# Patient Record
Sex: Male | Born: 1955 | Race: White | Hispanic: No | Marital: Married | Smoking: Former smoker
Health system: Southern US, Community
[De-identification: ages and names within clinical notes are randomized; demographics above are authoritative.]

## PROBLEM LIST (undated history)

## (undated) DIAGNOSIS — I2510 Atherosclerotic heart disease of native coronary artery without angina pectoris: Secondary | ICD-10-CM

## (undated) DIAGNOSIS — I10 Essential (primary) hypertension: Secondary | ICD-10-CM

## (undated) DIAGNOSIS — M1990 Unspecified osteoarthritis, unspecified site: Secondary | ICD-10-CM

## (undated) DIAGNOSIS — C801 Malignant (primary) neoplasm, unspecified: Secondary | ICD-10-CM

## (undated) DIAGNOSIS — G4730 Sleep apnea, unspecified: Secondary | ICD-10-CM

## (undated) DIAGNOSIS — G709 Myoneural disorder, unspecified: Secondary | ICD-10-CM

## (undated) DIAGNOSIS — F419 Anxiety disorder, unspecified: Secondary | ICD-10-CM

## (undated) DIAGNOSIS — E119 Type 2 diabetes mellitus without complications: Secondary | ICD-10-CM

## (undated) DIAGNOSIS — G459 Transient cerebral ischemic attack, unspecified: Secondary | ICD-10-CM

## (undated) HISTORY — PX: NO PAST SURGERIES: SHX2092

## (undated) HISTORY — PX: COLONOSCOPY: SHX174

---

## 2007-11-28 ENCOUNTER — Other Ambulatory Visit: Payer: Self-pay

## 2007-11-28 ENCOUNTER — Emergency Department: Payer: Self-pay | Admitting: Emergency Medicine

## 2013-04-24 ENCOUNTER — Inpatient Hospital Stay: Payer: Self-pay | Admitting: Internal Medicine

## 2013-04-24 LAB — URINALYSIS, COMPLETE
Bacteria: NONE SEEN
Bilirubin,UR: NEGATIVE
Blood: NEGATIVE
Glucose,UR: >=500 mg/dL (ref 0–75)
Nitrite: NEGATIVE
Ph: 5 (ref 4.5–8.0)
RBC,UR: 1 /HPF (ref 0–5)
Squamous Epithelial: 1
WBC UR: <1 /HPF (ref 0–5)

## 2013-04-24 LAB — COMPREHENSIVE METABOLIC PANEL
Anion Gap: 27 — ABNORMAL HIGH (ref 7–16)
Calcium, Total: 9.3 mg/dL (ref 8.5–10.1)
EGFR (Non-African Amer.): 49 — ABNORMAL LOW
Glucose: 496 mg/dL — ABNORMAL HIGH (ref 65–99)
Osmolality: 292 (ref 275–301)
SGOT(AST): 45 U/L — ABNORMAL HIGH (ref 15–37)
SGPT (ALT): 32 U/L (ref 12–78)
Sodium: 130 mmol/L — ABNORMAL LOW (ref 136–145)

## 2013-04-24 LAB — RAPID INFLUENZA A&B ANTIGENS

## 2013-04-24 LAB — CBC
HCT: 53.6 % — ABNORMAL HIGH (ref 40.0–52.0)
HGB: 17.2 g/dL (ref 13.0–18.0)
MCHC: 32 g/dL (ref 32.0–36.0)
MCV: 92 fL (ref 80–100)
Platelet: 365 10*3/uL (ref 150–440)
RDW: 14.6 % — ABNORMAL HIGH (ref 11.5–14.5)

## 2013-04-25 LAB — CBC WITH DIFFERENTIAL/PLATELET
Basophil %: 0.6 %
Eosinophil #: 0 10*3/uL (ref 0.0–0.7)
Eosinophil %: 0 %
Lymphocyte %: 8.7 %
MCH: 29.4 pg (ref 26.0–34.0)
Monocyte #: 1.7 x10 3/mm — ABNORMAL HIGH (ref 0.2–1.0)
Monocyte %: 16.6 %
Neutrophil #: 7.5 10*3/uL — ABNORMAL HIGH (ref 1.4–6.5)
Neutrophil %: 74.1 %
Platelet: 190 10*3/uL (ref 150–440)
RBC: 4.64 10*6/uL (ref 4.40–5.90)
RDW: 13.4 % (ref 11.5–14.5)
WBC: 10.1 10*3/uL (ref 3.8–10.6)

## 2013-04-25 LAB — HEMOGLOBIN A1C: Hemoglobin A1C: 12.1 % — ABNORMAL HIGH (ref 4.2–6.3)

## 2013-04-25 LAB — BASIC METABOLIC PANEL
Anion Gap: 10 (ref 7–16)
BUN: 29 mg/dL — ABNORMAL HIGH (ref 7–18)
Calcium, Total: 7.9 mg/dL — ABNORMAL LOW (ref 8.5–10.1)
Chloride: 106 mmol/L (ref 98–107)
Osmolality: 277 (ref 275–301)
Potassium: 2.8 mmol/L — ABNORMAL LOW (ref 3.5–5.1)
Sodium: 133 mmol/L — ABNORMAL LOW (ref 136–145)

## 2013-04-26 LAB — BASIC METABOLIC PANEL
Anion Gap: 7 (ref 7–16)
BUN: 12 mg/dL (ref 7–18)
Calcium, Total: 7.8 mg/dL — ABNORMAL LOW (ref 8.5–10.1)
Co2: 19 mmol/L — ABNORMAL LOW (ref 21–32)
Potassium: 4.1 mmol/L (ref 3.5–5.1)
Sodium: 138 mmol/L (ref 136–145)

## 2013-04-26 LAB — CBC WITH DIFFERENTIAL/PLATELET
Basophil %: 0.6 %
Eosinophil #: 0 10*3/uL (ref 0.0–0.7)
Eosinophil %: 0 %
HCT: 34.7 % — ABNORMAL LOW (ref 40.0–52.0)
HGB: 12.1 g/dL — ABNORMAL LOW (ref 13.0–18.0)
MCH: 29.4 pg (ref 26.0–34.0)
MCV: 85 fL (ref 80–100)
Monocyte #: 1.6 x10 3/mm — ABNORMAL HIGH (ref 0.2–1.0)
Monocyte %: 10.4 %
Neutrophil %: 77.8 %
Platelet: 184 10*3/uL (ref 150–440)
RBC: 4.1 10*6/uL — ABNORMAL LOW (ref 4.40–5.90)
RDW: 14 % (ref 11.5–14.5)

## 2013-04-27 LAB — BASIC METABOLIC PANEL
Glucose: 166 mg/dL — ABNORMAL HIGH (ref 65–99)
Potassium: 3.5 mmol/L (ref 3.5–5.1)
Sodium: 142 mmol/L (ref 136–145)

## 2013-04-27 LAB — CBC WITH DIFFERENTIAL/PLATELET
Basophil #: 0.1 10*3/uL (ref 0.0–0.1)
Basophil %: 0.3 %
Eosinophil #: 0 10*3/uL (ref 0.0–0.7)
Eosinophil %: 0 %
HCT: 39 % — ABNORMAL LOW (ref 40.0–52.0)
Lymphocyte #: 2.5 10*3/uL (ref 1.0–3.6)
MCHC: 34.6 g/dL (ref 32.0–36.0)
MCV: 85 fL (ref 80–100)
Monocyte #: 0.8 x10 3/mm (ref 0.2–1.0)
Monocyte %: 5.2 %
Neutrophil #: 12.2 10*3/uL — ABNORMAL HIGH (ref 1.4–6.5)
Neutrophil %: 78.6 %
Platelet: 206 10*3/uL (ref 150–440)
RDW: 14.1 % (ref 11.5–14.5)

## 2014-05-28 ENCOUNTER — Ambulatory Visit: Payer: Self-pay

## 2014-05-28 LAB — COMPREHENSIVE METABOLIC PANEL
Albumin: 4 g/dL (ref 3.4–5.0)
Alkaline Phosphatase: 176 U/L — ABNORMAL HIGH (ref 46–116)
Anion Gap: 10 (ref 7–16)
BILIRUBIN TOTAL: 0.5 mg/dL (ref 0.2–1.0)
BUN: 14 mg/dL (ref 7–18)
CO2: 29 mmol/L (ref 21–32)
CREATININE: 0.96 mg/dL (ref 0.60–1.30)
Calcium, Total: 9.6 mg/dL (ref 8.5–10.1)
Chloride: 95 mmol/L — ABNORMAL LOW (ref 98–107)
Glucose: 303 mg/dL — ABNORMAL HIGH (ref 65–99)
OSMOLALITY: 280 (ref 275–301)
Potassium: 4.3 mmol/L (ref 3.5–5.1)
SGOT(AST): 16 U/L (ref 15–37)
SGPT (ALT): 27 U/L (ref 14–63)
SODIUM: 134 mmol/L — AB (ref 136–145)
Total Protein: 8.2 g/dL (ref 6.4–8.2)

## 2014-05-28 LAB — CBC WITH DIFFERENTIAL/PLATELET
BASOS PCT: 1.1 %
Basophil #: 0.2 10*3/uL — ABNORMAL HIGH (ref 0.0–0.1)
EOS ABS: 0 10*3/uL (ref 0.0–0.7)
Eosinophil %: 0.1 %
HCT: 45.1 % (ref 40.0–52.0)
HGB: 15.1 g/dL (ref 13.0–18.0)
Lymphocyte #: 2.3 10*3/uL (ref 1.0–3.6)
Lymphocyte %: 13.2 %
MCH: 28.3 pg (ref 26.0–34.0)
MCHC: 33.4 g/dL (ref 32.0–36.0)
MCV: 85 fL (ref 80–100)
Monocyte #: 1.3 x10 3/mm — ABNORMAL HIGH (ref 0.2–1.0)
Monocyte %: 7.1 %
NEUTROS PCT: 78.5 %
Neutrophil #: 13.9 10*3/uL — ABNORMAL HIGH (ref 1.4–6.5)
Platelet: 319 10*3/uL (ref 150–440)
RBC: 5.32 10*6/uL (ref 4.40–5.90)
RDW: 12.8 % (ref 11.5–14.5)
WBC: 17.7 10*3/uL — AB (ref 3.8–10.6)

## 2014-05-28 LAB — SEDIMENTATION RATE: ERYTHROCYTE SED RATE: 48 mm/h — AB (ref 0–20)

## 2014-07-29 DIAGNOSIS — N529 Male erectile dysfunction, unspecified: Secondary | ICD-10-CM | POA: Insufficient documentation

## 2014-08-22 NOTE — H&P (Signed)
PATIENT NAME:  ILIJA, Troy [name] MR#:  [id number] DATE OF BIRTH:  May 15, 1955  DATE OF ADMISSION:  04/24/2013  PRIMARY CARE PHYSICIAN: At [name].   CHIEF COMPLAINT: Nausea, vomiting, abdominal pain, along with cough.   HISTORY OF PRESENT ILLNESS: [name] 59 year old male patient who has not been feeling well for 5 days now including cough and weakness, presents to the hospital after he had acute onset of nausea, vomiting, abdominal pain, started yesterday. The patient has not taken his Levemir 10 units b.i.d. dose for 2 days, as he has not had an appetite and did not eat much. Today, he has been found to be in DKA with bicarb of 6, blood sugars are greater than 500.   He mentioned that normally his blood sugars run around 150s. He is on Invokana and Levemir 10 units twice [name] day, and also uses NovoLog 3 to 4 units prior to meals. He has never had DKA in the past.   Presently, his nausea, vomiting and abdominal pain are [name] little better in the Emergency Room. The patient is being admitted for DKA.   PAST MEDICAL HISTORY:  Insulin-dependent diabetes mellitus.   PAST SURGICAL HISTORY: None.   SOCIAL HISTORY: The patient does not smoke, rare alcohol use. No illicit drugs. Works as [name] Pharmacist, hospital.   ALLERGIES: No known drug allergies.   CODE STATUS: Full code.   FAMILY HISTORY: Heart attack in his mother, which killed her.   REVIEW OF SYSTEMS:  CONSTITUTIONAL:  Complains of fatigue, weakness.  EYES: No blurred vision, pain or redness.  EARS, NOSE, THROAT: No tinnitus, ear pain, hearing loss.  RESPIRATORY:  Has cough.  CARDIOVASCULAR: No chest pain, orthopnea or edema.  GASTROINTESTINAL: No nausea, vomiting, diarrhea or abdominal pain.  GENITOURINARY: No dysuria, hematuria or frequency.  ENDOCRINE: No polyuria, nocturia or thyroid problems.  HEMATOLOGIC AND LYMPHATIC: No anemia, easy bruising or bleeding.  INTEGUMENTARY: No acne, rash or lesions.  MUSCULOSKELETAL: No back pain or arthritis.   NEUROLOGIC: No focal numbness, weakness or seizures. Has generalized weakness.  PSYCHIATRIC: No anxiety or depression.   HOME MEDICATIONS: Include:  1.  Levemir 10 units subQ b.i.d.  2.  Lovenox 3 to 4 units prior to meals.  3.  Invokana 1 tablet oral twice [name] day.  4.  Aspirin 325 mg daily.   PHYSICAL EXAMINATION: VITAL SIGNS: Shows temperature of 97.1 pulse of 111, respirations 20, blood pressure 163/70, saturating 96% on room air.  GENERAL: Moderately-built Caucasian male patient lying in bed in mild distress secondary to his abdominal pain.  PSYCHIATRIC:  He is alert and oriented x 3, restless.  HEENT: Atraumatic, normocephalic. Oral mucosa dry and pink. Pallor positive. No icterus. Pupils bilaterally equal and reactive to light.    NECK: Supple. No thyromegaly or palpable lymph nodes. Trachea midline. No carotid bruit or JVD.  CARDIOVASCULAR: S1, S2, tachycardic without any murmurs. Peripheral pulses 2+. No edema.  RESPIRATORY: Normal work of breathing with decreased air entry in the right base. No crackles or wheezing or rhonchi.  GASTROINTESTINAL: Soft abdomen. Tenderness diffusely. No rigidity or guarding. Bowel sounds present. No hepatosplenomegaly palpable.  GENITOURINARY:  No CVA tenderness or bladder distention.  SKIN: Warm and dry. No petechiae, rash or ulcers.  MUSCULOSKELETAL: No joint swelling, redness, effusion of the large joints. Normal muscle tone.  NEUROLOGICAL: Motor strength 5/5 in upper and lower extremities. Sensation is intact all over.  LYMPHATICS:  No cervical lymphadenopathy.   LABORATORY STUDIES: Show glucose of 495, 457  and 496. BUN 37, creatinine 1.55, sodium 130, potassium 4.8, chloride 97, bicarb of 6, GFR 49. AST, ALT, alk phos normal.  WBC 29.7 with hemoglobin 17.2, platelets of 365. Influenza [name] and B negative. Urinalysis shows no bacteria.   Lactic acid of 3.3.   ABG is pending.   Chest x-ray shows right lower lobe pneumonia.   ASSESSMENT AND  PLAN: 1.  Diabetic ketoacidosis with severely low bicarb. I expect the patient's pH to be extremely low.  This is secondary to the patient missing his Levemir at home and also having concurrent infection with pneumonia. The  patient has received 2 liters in the Emergency Room. We will bolus NS 2 liters normal saline stat and start him on aggressive IV fluid resuscitation. The patient will be admitted to CCU on an insulin drip with q.1 hour Accu-Cheks. BMP will be checked every 4 hours. We will start him on 20 units of Levemir once his bicarb and anion gap are better and can start diet.  2.  Right lower lobe pneumonia. The patient's symptoms started prior to his nausea and vomiting, so I do not suspect aspiration pneumonia in this patient.  We will treat him with Levaquin. The patient does have sepsis secondary to the pneumonia.  3.  Acute renal failure secondary to severe dehydration. We will start him on IV fluids and repeat creatinine in the morning.  4.  Pseudohyponatremia from the hyperglycemia.  5.  Deep vein thrombosis prophylaxis with Lovenox.   Time Spent today on this case of critically ill patient was 60 minutes.     ____________________________ Troy Alf Kelcey Wickstrom, MD srs:dmm D: 04/24/2013 19:36:16 ET T: 04/24/2013 19:56:06 ET JOB#: [id number]  cc: Alveta Heimlich R. Kerin Kren, MD, <Dictator> [facility] MD ELECTRONICALLY SIGNED 04/25/2013 15:42

## 2014-08-22 NOTE — Discharge Summary (Signed)
PATIENT NAME:  [name], [name] MR#:  [id number] DATE OF BIRTH:  October 06, 1955  DATE OF ADMISSION:  04/24/2013 DATE OF DISCHARGE:  04/27/2013  ADMISSION DIAGNOSES:   1.  Sepsis.   2.  Community-acquired pneumonia.   DISCHARGE DIAGNOSES: 1.  Sepsis, severe upon arrival secondary to community-acquired pneumonia.  2.  Community-acquired pneumonia. 3.  Diabetic ketoacidosis.  4.  Hypotension.  5.  Acute renal failure.   CONSULTATIONS:  None.    LABORATORY DATA AT DISCHARGE:  White blood cells 15, hemoglobin 13.5, hematocrit 39, platelets are 206.  Sodium 142, potassium 3.5, chloride 108, bicarb 29, BUN 11, creatinine 0.6, his glucose is 166.  Blood cultures negative to date.   HOSPITAL COURSE:  This is a very pleasant 59 year old male who was brought in with severe sepsis secondary to pneumonia and also found to have DKA.  For further details, please refer to the H and P. 1.  Severe sepsis.  The patient initially was admitted with tachycardia, leukocytosis and pneumonia on chest x-ray.  We treated his pneumonia.  His sepsis has resolved.  His blood cultures are negative to date.   2.  Community-acquired pneumonia.  The patient is on Levaquin.  The patient will continue Levaquin for two more days.  Blood cultures negative to date.  3.  DKA triggered by sepsis/pneumonia.  The patient was off of his insulin also which likely contributed to the DKA.  His hemoglobin A1c was 12, which shows significantly poor control as an outpatient.  The patient is very reluctant for overaggressive treatment due to a history of hypoglycemia in the past.  4.  Hypotension secondary to sepsis, improved with IV fluids.  5.  Acute renal failure secondary to poor perfusion from sepsis, which improved with IV fluids and treatment for sepsis.   DISCHARGE MEDICATIONS:  1.  Aspirin 325 mg daily.  2.  NovoLog sliding scale.  3.  Multivitamin 1 tablet daily.  4.  Levaquin 750 mg q. 24 hours x 2 days.  5.  Detemir 10 units  twice daily.   DISCHARGE DIET:  ADA diet.   DISCHARGE ACTIVIT:  As tolerated.   DISCHARGE FOLLOW-UP:  The patient will need to follow up with his primary care physician in 1 to 2 weeks.    TIME SPENT:  35 minutes.  The patient is medically stable for discharge.    ____________________________ Edna Rede P. Benjie Karvonen, MD spm:ea D: 04/27/2013 13:52:47 ET T: 04/27/2013 23:29:09 ET JOB#: [id number]  cc: Carvell Hoeffner P. Benjie Karvonen, MD, <Dictator> [facility] Donell Beers Ronika Kelson MD ELECTRONICALLY SIGNED 04/28/2013 14:46

## 2015-01-24 ENCOUNTER — Emergency Department: Payer: BC Managed Care – PPO

## 2015-01-24 ENCOUNTER — Encounter: Payer: Self-pay | Admitting: *Deleted

## 2015-01-24 DIAGNOSIS — I10 Essential (primary) hypertension: Secondary | ICD-10-CM | POA: Insufficient documentation

## 2015-01-24 DIAGNOSIS — I739 Peripheral vascular disease, unspecified: Secondary | ICD-10-CM | POA: Insufficient documentation

## 2015-01-24 DIAGNOSIS — Z794 Long term (current) use of insulin: Secondary | ICD-10-CM | POA: Insufficient documentation

## 2015-01-24 DIAGNOSIS — R531 Weakness: Secondary | ICD-10-CM | POA: Insufficient documentation

## 2015-01-24 DIAGNOSIS — Z87891 Personal history of nicotine dependence: Secondary | ICD-10-CM | POA: Insufficient documentation

## 2015-01-24 DIAGNOSIS — Z79899 Other long term (current) drug therapy: Secondary | ICD-10-CM | POA: Diagnosis not present

## 2015-01-24 DIAGNOSIS — Z7982 Long term (current) use of aspirin: Secondary | ICD-10-CM | POA: Diagnosis not present

## 2015-01-24 DIAGNOSIS — Z8673 Personal history of transient ischemic attack (TIA), and cerebral infarction without residual deficits: Secondary | ICD-10-CM | POA: Insufficient documentation

## 2015-01-24 DIAGNOSIS — E1165 Type 2 diabetes mellitus with hyperglycemia: Secondary | ICD-10-CM | POA: Insufficient documentation

## 2015-01-24 DIAGNOSIS — R51 Headache: Secondary | ICD-10-CM | POA: Diagnosis not present

## 2015-01-24 DIAGNOSIS — R202 Paresthesia of skin: Secondary | ICD-10-CM | POA: Diagnosis not present

## 2015-01-24 DIAGNOSIS — M79661 Pain in right lower leg: Secondary | ICD-10-CM | POA: Diagnosis present

## 2015-01-24 DIAGNOSIS — R739 Hyperglycemia, unspecified: Secondary | ICD-10-CM | POA: Diagnosis present

## 2015-01-24 DIAGNOSIS — R200 Anesthesia of skin: Secondary | ICD-10-CM | POA: Diagnosis not present

## 2015-01-24 LAB — COMPREHENSIVE METABOLIC PANEL
ALT: 30 U/L (ref 17–63)
AST: 37 U/L (ref 15–41)
Albumin: 4.3 g/dL (ref 3.5–5.0)
Alkaline Phosphatase: 124 U/L (ref 38–126)
Anion gap: 10 (ref 5–15)
BILIRUBIN TOTAL: 0.5 mg/dL (ref 0.3–1.2)
BUN: 17 mg/dL (ref 6–20)
CO2: 27 mmol/L (ref 22–32)
Calcium: 9.8 mg/dL (ref 8.9–10.3)
Chloride: 99 mmol/L — ABNORMAL LOW (ref 101–111)
Creatinine, Ser: 0.85 mg/dL (ref 0.61–1.24)
Glucose, Bld: 393 mg/dL — ABNORMAL HIGH (ref 65–99)
POTASSIUM: 3.3 mmol/L — AB (ref 3.5–5.1)
Sodium: 136 mmol/L (ref 135–145)
TOTAL PROTEIN: 7.2 g/dL (ref 6.5–8.1)

## 2015-01-24 LAB — DIFFERENTIAL
BASOS ABS: 0 10*3/uL (ref 0–0.1)
Basophils Relative: 0 %
EOS ABS: 0.1 10*3/uL (ref 0–0.7)
EOS PCT: 1 %
LYMPHS ABS: 3.2 10*3/uL (ref 1.0–3.6)
Lymphocytes Relative: 33 %
MONO ABS: 0.8 10*3/uL (ref 0.2–1.0)
MONOS PCT: 9 %
Neutro Abs: 5.6 10*3/uL (ref 1.4–6.5)
Neutrophils Relative %: 57 %

## 2015-01-24 LAB — CBC
HEMATOCRIT: 42.7 % (ref 40.0–52.0)
HEMOGLOBIN: 14.5 g/dL (ref 13.0–18.0)
MCH: 28.9 pg (ref 26.0–34.0)
MCHC: 33.9 g/dL (ref 32.0–36.0)
MCV: 85.2 fL (ref 80.0–100.0)
Platelets: 254 10*3/uL (ref 150–440)
RBC: 5.02 MIL/uL (ref 4.40–5.90)
RDW: 13.2 % (ref 11.5–14.5)
WBC: 9.7 10*3/uL (ref 3.8–10.6)

## 2015-01-24 LAB — TROPONIN I

## 2015-01-24 LAB — GLUCOSE, CAPILLARY: Glucose-Capillary: 364 mg/dL — ABNORMAL HIGH (ref 65–99)

## 2015-01-24 LAB — PROTIME-INR
INR: 0.96
Prothrombin Time: 13 seconds (ref 11.4–15.0)

## 2015-01-24 LAB — APTT: aPTT: 24 seconds (ref 24–36)

## 2015-01-24 NOTE — ED Notes (Signed)
Pt c/o R facial and R arm numbness and tingling accompanied w/ headache, pt states it was not a bad headache, but he never gets headaches per his report. Pt states the tingling persisted in R upper lip. Pt c/o tightness in R calf starting today @ 1800. Pt c/o weakness and fatigue yesterday afternoon after the incident occurred at 1400 yesterday. Grips and push pulls in upper and lower extremities are equal and strong. No facial droop or aphasia noted. No tongue deviation noted. Pt is ambulatory and A&O x 4.

## 2015-01-25 ENCOUNTER — Observation Stay: Payer: BC Managed Care – PPO

## 2015-01-25 ENCOUNTER — Observation Stay
Admission: EM | Admit: 2015-01-25 | Discharge: 2015-01-25 | Disposition: A | Discharge status: Home / Self Care | Payer: BC Managed Care – PPO | Attending: Internal Medicine | Admitting: Internal Medicine

## 2015-01-25 ENCOUNTER — Observation Stay: Admit: 2015-01-25 | Payer: BC Managed Care – PPO

## 2015-01-25 ENCOUNTER — Emergency Department: Payer: BC Managed Care – PPO

## 2015-01-25 DIAGNOSIS — R739 Hyperglycemia, unspecified: Secondary | ICD-10-CM

## 2015-01-25 DIAGNOSIS — G459 Transient cerebral ischemic attack, unspecified: Secondary | ICD-10-CM | POA: Diagnosis present

## 2015-01-25 DIAGNOSIS — T148XXA Other injury of unspecified body region, initial encounter: Secondary | ICD-10-CM

## 2015-01-25 DIAGNOSIS — G451 Carotid artery syndrome (hemispheric): Secondary | ICD-10-CM | POA: Diagnosis not present

## 2015-01-25 DIAGNOSIS — M79661 Pain in right lower leg: Secondary | ICD-10-CM

## 2015-01-25 DIAGNOSIS — I639 Cerebral infarction, unspecified: Secondary | ICD-10-CM

## 2015-01-25 HISTORY — DX: Type 2 diabetes mellitus without complications: E11.9

## 2015-01-25 LAB — GLUCOSE, CAPILLARY
Glucose-Capillary: 67 mg/dL (ref 65–99)
Glucose-Capillary: 98 mg/dL (ref 65–99)
Glucose-Capillary: 106 mg/dL — ABNORMAL HIGH (ref 65–99)

## 2015-01-25 LAB — HEMOGLOBIN A1C: Hgb A1c MFr Bld: 12 % — ABNORMAL HIGH (ref 4.0–6.0)

## 2015-01-25 LAB — LIPID PANEL
Cholesterol: 217 mg/dL — ABNORMAL HIGH (ref 0–200)
HDL: 76 mg/dL (ref >40)
LDL Cholesterol: 121 mg/dL — ABNORMAL HIGH (ref 0–99)
Total CHOL/HDL Ratio: 2.9 RATIO
Triglycerides: 99 mg/dL (ref <150)
VLDL: 20 mg/dL (ref 0–40)

## 2015-01-25 MED ORDER — DOCUSATE SODIUM 100 MG PO CAPS
100.0000 mg | ORAL_CAPSULE | Freq: Two times a day (BID) | ORAL | Status: DC
  Administered 2015-01-25: 09:00:00 100 mg via ORAL
  Filled 2015-01-25: qty 1

## 2015-01-25 MED ORDER — ONDANSETRON HCL 4 MG/2ML IJ SOLN: 4.0000 mg | Freq: Four times a day (QID) | INTRAMUSCULAR | Status: DC | PRN

## 2015-01-25 MED ORDER — ACETAMINOPHEN 650 MG RE SUPP
650.0000 mg | Freq: Four times a day (QID) | RECTAL | Status: DC | PRN
Start: 2015-01-25 — End: 2015-01-25

## 2015-01-25 MED ORDER — ONDANSETRON HCL 4 MG/2ML IJ SOLN: 4.0000 mg | Freq: Once | INTRAMUSCULAR | Status: DC

## 2015-01-25 MED ORDER — ONDANSETRON HCL 4 MG PO TABS
4.0000 mg | ORAL_TABLET | Freq: Four times a day (QID) | ORAL | Status: DC | PRN
Start: 2015-01-25 — End: 2015-01-25

## 2015-01-25 MED ORDER — INSULIN DETEMIR 100 UNIT/ML ~~LOC~~ SOLN: 20.0000 [IU] | Freq: Every morning | SUBCUTANEOUS | Status: DC

## 2015-01-25 MED ORDER — INSULIN ASPART 100 UNIT/ML ~~LOC~~ SOLN
0.0000 [IU] | Freq: Three times a day (TID) | SUBCUTANEOUS | Status: DC
  Administered 2015-01-25: 11 [IU] via SUBCUTANEOUS
  Filled 2015-01-25: qty 11

## 2015-01-25 MED ORDER — ACETAMINOPHEN 325 MG PO TABS: 650.0000 mg | ORAL_TABLET | Freq: Four times a day (QID) | ORAL | Status: DC | PRN

## 2015-01-25 MED ORDER — INSULIN DETEMIR 100 UNIT/ML ~~LOC~~ SOLN
20.0000 [IU] | Freq: Every day | SUBCUTANEOUS | Status: DC
  Filled 2015-01-25: qty 0.2

## 2015-01-25 MED ORDER — MORPHINE SULFATE (PF) 2 MG/ML IV SOLN: 2.0000 mg | Freq: Once | INTRAVENOUS | Status: DC

## 2015-01-25 MED ORDER — ATORVASTATIN CALCIUM 40 MG PO TABS: 40.0000 mg | ORAL_TABLET | Freq: Every day | ORAL | Status: DC

## 2015-01-25 MED ORDER — HEPARIN SODIUM (PORCINE) 5000 UNIT/ML IJ SOLN
5000.0000 [IU] | Freq: Three times a day (TID) | INTRAMUSCULAR | Status: DC
Start: 2015-01-25 — End: 2015-01-25
  Administered 2015-01-25 (×2): 5000 [IU] via SUBCUTANEOUS
  Filled 2015-01-25 (×2): qty 1

## 2015-01-25 MED ORDER — ASPIRIN 81 MG PO CHEW
324.0000 mg | CHEWABLE_TABLET | Freq: Once | ORAL | Status: AC
  Administered 2015-01-25: 324 mg via ORAL
  Filled 2015-01-25: qty 4

## 2015-01-25 MED ORDER — POTASSIUM CHLORIDE 20 MEQ PO PACK
40.0000 meq | PACK | Freq: Once | ORAL | Status: AC
  Administered 2015-01-25: 40 meq via ORAL
  Filled 2015-01-25: qty 2

## 2015-01-25 MED ORDER — INSULIN DETEMIR 100 UNIT/ML ~~LOC~~ SOLN: 18.0000 [IU] | Freq: Every day | SUBCUTANEOUS | Status: DC

## 2015-01-25 MED ORDER — POTASSIUM CHLORIDE IN NACL 20-0.9 MEQ/L-% IV SOLN
INTRAVENOUS | Status: DC
  Administered 2015-01-25: 08:00:00 via INTRAVENOUS
  Filled 2015-01-25 (×4): qty 1000

## 2015-01-25 MED ORDER — SODIUM CHLORIDE 0.9 % IJ SOLN
3.0000 mL | Freq: Two times a day (BID) | INTRAMUSCULAR | Status: DC
  Administered 2015-01-25: 3 mL via INTRAVENOUS

## 2015-01-25 NOTE — Plan of Care (Signed)
Problem: Discharge/Transitional Outcomes Goal: Hemodynamically stable Outcome: Progressing See note.  No further episode. neuros intact. nih  Basically 0. Pt diabetic  On ssi Goal: Family and patient agree upon discharge plan Outcome: Progressing Wife and family involved in pts  care

## 2015-01-25 NOTE — Discharge Instructions (Signed)

## 2015-01-25 NOTE — Progress Notes (Addendum)
Spoke with md re  Ss insulin. He felt it was   B/c pt had not eaten.  Will continue to moniter  Pt went earlier for mri/ echo and  bil US carotid

## 2015-01-25 NOTE — ED Notes (Signed)
Pt went to ultrasound.

## 2015-01-25 NOTE — ED Provider Notes (Signed)
[facility] Emergency Department Sheryl Saintil Note  ____________________________________________  Time seen: Approximately 1:48 AM  I have reviewed the triage vital signs and the nursing notes.   HISTORY  Chief Complaint Numbness    HPI [name] is a 59 y.o. male who presents to the ED from home with the chief complaint of numbness in right face and arm. Patient is a poorly controlled insulin-dependent diabetic with baseline sugars in the 300s. Noted right arm tingling yesterday lasting minutes. Today he had tingling on the right side of his face and lip. Denies slurred speech, facial droop, extremity weakness. Travel an hour yesterday and noted right calf pain starting approximately 6 PM. Felt generally fatigued after onset of tingling yesterday which occurred approximately 2 PM. No prior similar symptoms. Denies recent fever, chills, neck pain, vision changes, chest pain, shortness of breath, vomiting, diarrhea. Has had intermittent, gradual onset headaches.   Past Medical History  Diagnosis Date  . Diabetes mellitus without complication     There are no active problems to display for this patient.   History reviewed. No pertinent past surgical history.  Current Outpatient Rx  Name  Route  Sig  Dispense  Refill  . insulin aspart (NOVOLOG) 100 UNIT/ML injection   Subcutaneous   Inject into the skin 3 (three) times daily before meals.         . insulin detemir (LEVEMIR) 100 UNIT/ML injection   Subcutaneous   Inject 18 Units into the skin every morning.         . insulin detemir (LEVEMIR) 100 UNIT/ML injection   Subcutaneous   Inject 16 Units into the skin at bedtime.           Allergies Review of patient's allergies indicates no known allergies.  History reviewed. No pertinent family history.  Social History Social History  Substance Use Topics  . Smoking status: Former Smoker    Types: Cigarettes  . Smokeless tobacco: Never Used   . Alcohol Use: Yes     Comment: mixed drink 1-2 times per month    Review of Systems Constitutional: No fever/chills Eyes: No visual changes. ENT: No sore throat. Cardiovascular: Denies chest pain. Respiratory: Denies shortness of breath. Gastrointestinal: No abdominal pain.  No nausea, no vomiting.  No diarrhea.  No constipation. Genitourinary: Negative for dysuria. Musculoskeletal: Negative for back pain. Skin: Negative for rash. Neurological: Positive for headaches, numbness/tingling in right arm and face. Negative for focal weakness.   10-point ROS otherwise negative.  ____________________________________________   PHYSICAL EXAM:  VITAL SIGNS: ED Triage Vitals  Enc Vitals Group     BP 01/24/15 2250 167/85 mmHg     Pulse Rate 01/24/15 2250 90     Resp 01/24/15 2250 20     Temp 01/24/15 2250 98.2 F (36.8 C)     Temp Source 01/24/15 2250 Oral     SpO2 01/24/15 2250 97 %     Weight 01/24/15 2250 134 lb (60.782 kg)     Height 01/24/15 2250 5\' 3"  (1.6 m)     Head Cir --      Peak Flow --      Pain Score 01/24/15 2251 1     Pain Loc --      Pain Edu? --      Excl. in [place]? --     Constitutional: Alert and oriented. Well appearing and in no acute distress. Eyes: Conjunctivae are normal. PERRL. EOMI. Head: Atraumatic. Nose: No congestion/rhinnorhea. Mouth/Throat: Mucous membranes  are moist.  Oropharynx non-erythematous. Neck: No stridor. No carotid bruits. Cardiovascular: Normal rate, regular rhythm. Grossly normal heart sounds.  Good peripheral circulation. Respiratory: Normal respiratory effort.  No retractions. Lungs CTAB. Gastrointestinal: Soft and nontender. No distention. No abdominal bruits. No CVA tenderness. Musculoskeletal: Right calf tender to palpation. Supple calf without evidence for compartment syndrome. There is no swelling. Extremity is warm without evidence for ischemia. 2+ distal pulses noted. Neurologic:  Normal speech and language. No gross focal  neurologic deficits are appreciated. Diminished sensation right face. Skin:  Skin is warm, dry and intact. No rash noted. Psychiatric: Mood and affect are normal. Speech and behavior are normal.  ____________________________________________   LABS (all labs ordered are listed, but only abnormal results are displayed)  Labs Reviewed  COMPREHENSIVE METABOLIC PANEL - Abnormal; Notable for the following:    Potassium 3.3 (*)    Chloride 99 (*)    Glucose, Bld 393 (*)    All other components within normal limits  GLUCOSE, CAPILLARY - Abnormal; Notable for the following:    Glucose-Capillary 364 (*)    All other components within normal limits  PROTIME-INR  APTT  CBC  DIFFERENTIAL  TROPONIN I  CBG MONITORING, ED   ____________________________________________  EKG  ED ECG REPORT I, SUNG,JADE J, the attending physician, personally viewed and interpreted this ECG.   Date: 01/25/2015  EKG Time: 2327  Rate: 82  Rhythm: normal EKG, normal sinus rhythm  Axis: Normal  Intervals:none  ST&T Change: Nonspecific  ____________________________________________  RADIOLOGY  CT head without contrast interpreted per Dr. Gerilyn Nestle: No acute intracranial abnormalities. Mild diffuse atrophy.  [place] RLE interpreted per Dr. Joneen Caraway: 1. 1.9 x 1.8 x 1.0 cm mass in the upper right calf musculature, corresponding to the location of pain and mass felt by the patient. Differential considerations include a hematoma due to a muscle tear. A neoplasm is also a possibility. These possibilities could be differentiated with pre and postcontrast magnetic resonance imaging of the calf. 2. No deep venous thrombosis. ____________________________________________   PROCEDURES  Procedure(s) performed: None  Critical Care performed: No  ____________________________________________   INITIAL IMPRESSION / ASSESSMENT AND PLAN / ED COURSE  Pertinent labs & imaging results that were available during my care of  the patient were reviewed by me and considered in my medical decision making (see chart for details).  59 year old male with poorly controlled blood sugars presenting with escalating TIA symptoms and right calf pain. CT head negative for intracranial hemorrhage. Will administer aspirin. Will obtain Doppler ultrasound of right calf to evaluate for DVT.  ----------------------------------------- 4:24 AM on 01/25/2015 -----------------------------------------  Updated patient & spouse of [place] results. Discussed case with hospitalist who will evaluate patient in ED for admission. ____________________________________________   FINAL CLINICAL IMPRESSION(S) / ED DIAGNOSES  Final diagnoses:  Transient cerebral ischemia, unspecified transient cerebral ischemia type  Calf pain, right  Hematoma  Hyperglycemia      Paulette Blanch, MD 01/25/15 [id number]

## 2015-01-25 NOTE — Plan of Care (Signed)
Problem: Discharge/Transitional Outcomes Goal: PCP appointment made and transportation plan in place Outcome: Adequate for Discharge Pt to f/u with  Endocrine md at [facility] in 1 week

## 2015-01-25 NOTE — Progress Notes (Signed)
Pt for discharge home. A/o.  nih 0 discharge instructions discussed with pt and wife.  presc given and  Discussed. Home meds discussed. Pt to  Call tomorrow for f/u appt  With endocrinology in one week. Diet/ activity and f/u discussed. Talked with pt  Regarding  Foods he can eat and portion size.  Talked with him about  Importance of exercising at least 4-5 times per week. . Pt verbalize understanding.home at this time via w/c w/o c/o.

## 2015-01-25 NOTE — Progress Notes (Signed)
Called to pts room . dtr was at bedside. Both pt and dtr reported pt had episode of not  Being able to articulate  All of his words. He knew what he wanted to say but something different came out.  I did not  Notice any  Slurred speech or inability to articulate words.  Pt  Says  It lasted  For about a minute or 2. Speech was  Clear and pt was able to answer all  Questions and follow commands. A little while earlier  pts bloodsugar  Dropped to 39 . He had been given  11 units of coverage for  bs of 364.   Juice given and  He said he felt better.  Pt had not earen. md was notified of  Findings. Pt got a food tray and ate and   Felt  Better per pt.  No further  S/s of  1st finding so far. Will continue to moniter.

## 2015-01-25 NOTE — H&P (Signed)
[name] is an 59 y.o. male.   Chief Complaint: Numbness and tingling HPI: The patient presents emergency department after experiencing changes in sensation of his right arm and right side of his face. 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 The following day the patient was walking outside on a field trip with his class when the right side of his face began to tingle. All symptoms have resolved at this time. Occasionally the patient admits to feeling as though his gum has been "injected with Novocain". He denies any associated weakness, difficulty swallowing or speaking, or visual changes. He also denies chest pain, shortness of breath, nausea, vomiting or diaphoresis. Past medical history is significant for diabetes the patient admits is poorly controlled. Due to his risk factors the urgency partly staff called for admission.  Past Medical History  Diagnosis Date  . Diabetes mellitus without complication     History reviewed. No pertinent past surgical history.  History reviewed. No pertinent family history. Social History:  reports that he has quit smoking. His smoking use included Cigarettes. He has never used smokeless tobacco. He reports that he drinks alcohol. He reports that he does not use illicit drugs.  Allergies: No Known Allergies  Medications Prior to Admission  Medication Sig Dispense Refill  . aspirin 325 MG tablet Take 325 mg by mouth daily.    . insulin aspart (NOVOLOG) 100 UNIT/ML injection Inject into the skin 3 (three) times daily before meals. Sliding scale 1 unit per 10 carbs    . insulin detemir (LEVEMIR) 100 UNIT/ML injection Inject 18 Units into the skin every morning.    . insulin detemir (LEVEMIR) 100 UNIT/ML injection Inject 16 Units into the skin at bedtime.    . Multiple Vitamin (MULTIVITAMIN WITH MINERALS) TABS tablet Take 1 tablet by mouth daily.    . Omega-3 Fatty Acids (FISH OIL PO) Take  1 capsule by mouth daily.      Results for orders placed or performed during the hospital encounter of 01/25/15 (from the past 48 hour(s))  Protime-INR     Status: None   Collection Time: 01/24/15 11:10 PM  Result Value Ref Range   Prothrombin Time 13.0 11.4 - 15.0 seconds   INR 0.96   APTT     Status: None   Collection Time: 01/24/15 11:10 PM  Result Value Ref Range   aPTT 24 24 - 36 seconds  CBC     Status: None   Collection Time: 01/24/15 11:10 PM  Result Value Ref Range   WBC 9.7 3.8 - 10.6 K/uL   RBC 5.02 4.40 - 5.90 MIL/uL   Hemoglobin 14.5 13.0 - 18.0 g/dL   HCT 42.7 40.0 - 52.0 %   MCV 85.2 80.0 - 100.0 fL   MCH 28.9 26.0 - 34.0 pg   MCHC 33.9 32.0 - 36.0 g/dL   RDW 13.2 11.5 - 14.5 %   Platelets 254 150 - 440 K/uL  Differential     Status: None   Collection Time: 01/24/15 11:10 PM  Result Value Ref Range   Neutrophils Relative % 57 %   Neutro Abs 5.6 1.4 - 6.5 K/uL   Lymphocytes Relative 33 %   Lymphs Abs 3.2 1.0 - 3.6 K/uL   Monocytes Relative 9 %   Monocytes Absolute 0.8 0.2 - 1.0 K/uL   Eosinophils Relative 1 %   Eosinophils Absolute 0.1 0 - 0.7 K/uL   Basophils  Relative 0 %   Basophils Absolute 0.0 0 - 0.1 K/uL  Comprehensive metabolic panel     Status: Abnormal   Collection Time: 01/24/15 11:10 PM  Result Value Ref Range   Sodium 136 135 - 145 mmol/L   Potassium 3.3 (L) 3.5 - 5.1 mmol/L   Chloride 99 (L) 101 - 111 mmol/L   CO2 27 22 - 32 mmol/L   Glucose, Bld 393 (H) 65 - 99 mg/dL   BUN 17 6 - 20 mg/dL   Creatinine, Ser 0.85 0.61 - 1.24 mg/dL   Calcium 9.8 8.9 - 10.3 mg/dL   Total Protein 7.2 6.5 - 8.1 g/dL   Albumin 4.3 3.5 - 5.0 g/dL   AST 37 15 - 41 U/L   ALT 30 17 - 63 U/L   Alkaline Phosphatase 124 38 - 126 U/L   Total Bilirubin 0.5 0.3 - 1.2 mg/dL   GFR calc non Af Amer >60 >60 mL/min   GFR calc Af Amer >60 >60 mL/min    Comment: (NOTE) The eGFR has been calculated using the CKD EPI equation. This calculation has not been validated in all  clinical situations. eGFR's persistently <60 mL/min signify possible Chronic Kidney Disease.    Anion gap 10 5 - 15  Troponin I     Status: None   Collection Time: 01/24/15 11:10 PM  Result Value Ref Range   Troponin I <0.03 <0.031 ng/mL    Comment:        NO INDICATION OF MYOCARDIAL INJURY.   Glucose, capillary     Status: Abnormal   Collection Time: 01/24/15 11:32 PM  Result Value Ref Range   Glucose-Capillary 364 (H) 65 - 99 mg/dL   Ct Head Wo Contrast  01/24/2015   CLINICAL DATA:  Right facial and right arm numbness and tingling with headache, weakness, and fatigue starting yesterday at 1400 hours.  EXAM: CT HEAD WITHOUT CONTRAST  TECHNIQUE: Contiguous axial images were obtained from the base of the skull through the vertex without intravenous contrast.  COMPARISON:  11/28/2007  FINDINGS: Mild diffuse cerebral atrophy. No significant ventricular dilatation. No mass effect or midline shift. No abnormal extra-axial fluid collections. Gray-white matter junctions are distinct. Basal cisterns are not effaced. No evidence of acute intracranial hemorrhage. No depressed skull fractures. Visualized paranasal sinuses and mastoid air cells are not opacified.  IMPRESSION: No acute intracranial abnormalities.  Mild diffuse atrophy.   Electronically Signed   By: Lucienne Capers M.D.   On: 01/24/2015 23:53   US Venous Img Lower Unilateral Right  01/25/2015   CLINICAL DATA:  Right calf pain and calf mass felt by the patient.  EXAM: RIGHT LOWER EXTREMITY VENOUS DOPPLER ULTRASOUND  TECHNIQUE: Gray-scale sonography with graded compression, as well as color Doppler and duplex ultrasound were performed to evaluate the lower extremity deep venous systems from the level of the common femoral vein and including the common femoral, femoral, profunda femoral, popliteal and calf veins including the posterior tibial, peroneal and gastrocnemius veins when visible. The superficial great saphenous vein was also  interrogated. Spectral Doppler was utilized to evaluate flow at rest and with distal augmentation maneuvers in the common femoral, femoral and popliteal veins.  COMPARISON:  None.  FINDINGS: Contralateral Common Femoral Vein: Respiratory phasicity is normal and symmetric with the symptomatic side. No evidence of thrombus. Normal compressibility.  Common Femoral Vein: No evidence of thrombus. Normal compressibility, respiratory phasicity and response to augmentation.  Saphenofemoral Junction: No evidence of thrombus. Normal compressibility and  flow on color Doppler imaging.  Profunda Femoral Vein: No evidence of thrombus. Normal compressibility and flow on color Doppler imaging.  Femoral Vein: No evidence of thrombus. Normal compressibility, respiratory phasicity and response to augmentation.  Popliteal Vein: No evidence of thrombus. Normal compressibility, respiratory phasicity and response to augmentation.  Calf Veins: No evidence of thrombus. Normal compressibility and flow on color Doppler imaging.  Superficial Great Saphenous Vein: No evidence of thrombus. Normal compressibility and flow on color Doppler imaging.  Venous Reflux:  None.  Other Findings: 1.9 x 1.8 x 1.0 cm oval, heterogeneous mass within the musculature of the upper right calf, corresponding to the location of pain and mass felt by the patient. No internal blood flow with color Doppler.  IMPRESSION: 1. 1.9 x 1.8 x 1.0 cm mass in the upper right calf musculature, corresponding to the location of pain and mass felt by the patient. Differential considerations include a hematoma due to a muscle tear. A neoplasm is also a possibility. These possibilities could be differentiated with pre and postcontrast magnetic resonance imaging of the calf. 2. No deep venous thrombosis.   Electronically Signed   By: Claudie Revering M.D.   On: 01/25/2015 03:54    Review of Systems  Constitutional: Negative for fever and chills.  HENT: Negative for sore throat and  tinnitus.   Eyes: Negative for blurred vision and redness.  Respiratory: Negative for cough and shortness of breath.   Cardiovascular: Negative for chest pain, palpitations, orthopnea and PND.  Gastrointestinal: Negative for nausea, vomiting, abdominal pain and diarrhea.  Genitourinary: Negative for dysuria, urgency and frequency.  Musculoskeletal: Negative for myalgias and joint pain.  Skin: Negative for rash.       No lesions  Neurological: Negative for speech change, focal weakness and weakness.  Endo/Heme/Allergies: Does not bruise/bleed easily.       No temperature intolerance  Psychiatric/Behavioral: Negative for depression and suicidal ideas.    Blood pressure 163/85, pulse 74, temperature 97.6 F (36.4 C), temperature source Oral, resp. rate 20, height _0  (1.6 m), weight 60.782 kg (134 lb), SpO2 96 %. Physical Exam  Nursing note and vitals reviewed. Constitutional: He is oriented to person, place, and time. He appears well-developed and well-nourished. No distress.  HENT:  Head: Normocephalic and atraumatic.  Mouth/Throat: Oropharynx is clear and moist.  Eyes: Conjunctivae are normal. Pupils are equal, round, and reactive to light. No scleral icterus.  Neck: Normal range of motion. Neck supple. No JVD present. No tracheal deviation present. No thyromegaly present.  Cardiovascular: Normal rate, regular rhythm and normal heart sounds.  Exam reveals no gallop and no friction rub.   No murmur heard. Respiratory: Effort normal and breath sounds normal. No respiratory distress.  GI: Soft. Bowel sounds are normal. He exhibits no distension. There is no tenderness.  Genitourinary:  Deferred  Musculoskeletal: Normal range of motion. He exhibits no edema.  Lymphadenopathy:    He has no cervical adenopathy.  Neurological: He is alert and oriented to person, place, and time. No cranial nerve deficit.  Skin: Skin is warm and dry. No rash noted. No erythema.  Psychiatric: He has a  normal mood and affect. His behavior is normal. Judgment and thought content normal.     Assessment/Plan This is a 59 year old Caucasian male admitted for resolved TIA symptoms. 1. TIA: No residual symptoms at this time. CT of the head is normal. I have ordered an MRI of his brain as well as carotid ultrasounds. Neurology consult ordered.  Started the patient on aspirin. 2. Diabetes mellitus type 2: Uncontrolled. The patient reports his last A1c was 12.1. I have adjusted his basal insulin dose for hospital diet and started him on sliding scale insulin. The patient carb counts at home but admits that he alters his calculated dose due to severe hypoglycemia. 3. Hypertension: Blood pressure fluctuates out of control at times. The patient benefit from an antihypertensive agent. He is reluctant to start more medications. 4. Hyperlipidemia: Recommended CVA risk reduction by starting statin. Patient would like more time to think about it. Lipid panel pending. 5. DVT prophylaxis: Heparin 6. GI prophylaxis: None The patient is a full code. Time spent on admission orders and patient care approximately 35 minutes.   Harrie Foreman 01/25/2015, 7:50 AM

## 2015-01-25 NOTE — Consult Note (Signed)
CC: numbness R arm and R side of face   HPI: [name] is an 59 y.o. male with history of DM  presents emergency department after experiencing changes in sensation of his right arm and right side of his face. 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  No acute abnormality on MRI of brain.   Past Medical History  Diagnosis Date  . Diabetes mellitus without complication     History reviewed. No pertinent past surgical history.  History reviewed. No pertinent family history.  Social History:  reports that he has quit smoking. His smoking use included Cigarettes. He has never used smokeless tobacco. He reports that he drinks alcohol. He reports that he does not use illicit drugs.  No Known Allergies  Medications: I have reviewed the patient's current medications.  ROS: History obtained from the patient  General ROS: negative for - chills, fatigue, fever, night sweats, weight gain or weight loss Psychological ROS: negative for - behavioral disorder, hallucinations, memory difficulties, mood swings or suicidal ideation Ophthalmic ROS: negative for - blurry vision, double vision, eye pain or loss of vision ENT ROS: negative for - epistaxis, nasal discharge, oral lesions, sore throat, tinnitus or vertigo Allergy and Immunology ROS: negative for - hives or itchy/watery eyes Hematological and Lymphatic ROS: negative for - bleeding problems, bruising or swollen lymph nodes Endocrine ROS: negative for - galactorrhea, hair pattern changes, polydipsia/polyuria or temperature intolerance Respiratory ROS: negative for - cough, hemoptysis, shortness of breath or wheezing Cardiovascular ROS: negative for - chest pain, dyspnea on exertion, edema or irregular heartbeat Gastrointestinal ROS: negative for - abdominal pain, diarrhea, hematemesis, nausea/vomiting or stool incontinence Genito-Urinary ROS: negative for - dysuria,  hematuria, incontinence or urinary frequency/urgency Musculoskeletal ROS: negative for - joint swelling or muscular weakness Neurological ROS: as noted in HPI Dermatological ROS: negative for rash and skin lesion changes    Neurological Examination Mental Status: Alert, oriented, thought content appropriate.  Speech fluent without evidence of aphasia.  Able to follow 3 step commands without difficulty. Cranial Nerves: II: Discs flat bilaterally; Visual fields grossly normal, pupils equal, round, reactive to light and accommodation III,IV, VI: ptosis not present, extra-ocular motions intact bilaterally V,VII: smile symmetric, facial light touch sensation normal bilaterally VIII: hearing normal bilaterally IX,X: gag reflex present XI: bilateral shoulder shrug XII: midline tongue extension Motor: Right : Upper extremity   5/5    Left:     Upper extremity   5/5  Lower extremity   5/5     Lower extremity   5/5 Tone and bulk:normal tone throughout; no atrophy noted Sensory: Pinprick and light touch intact throughout, bilaterally Deep Tendon Reflexes: 1+ and symmetric throughout with being absent in the lower extremities.  Plantars: Right: downgoing   Left: downgoing Cerebellar: normal finger-to-nose, normal rapid alternating movements and normal heel-to-shin test Gait: not tested      Laboratory Studies:   Basic Metabolic Panel:  Recent Labs Lab 01/24/15 2310  NA 136  K 3.3*  CL 99*  CO2 27  GLUCOSE 393*  BUN 17  CREATININE 0.85  CALCIUM 9.8    Liver Function Tests:  Recent Labs Lab 01/24/15 2310  AST 37  ALT 30  ALKPHOS 124  BILITOT 0.5  PROT 7.2  ALBUMIN 4.3   No results for input(s): LIPASE, AMYLASE in the last 168 hours. No results for input(s): AMMONIA in the last 168 hours.  CBC:  Recent  Labs Lab 01/24/15 2310  WBC 9.7  NEUTROABS 5.6  HGB 14.5  HCT 42.7  MCV 85.2  PLT 254    Cardiac Enzymes:  Recent Labs Lab 01/24/15 2310  TROPONINI  <0.03    BNP: Invalid input(s): POCBNP  CBG:  Recent Labs Lab 01/24/15 2332 01/25/15 1010 01/25/15 1025 01/25/15 1324  GLUCAP 364* 76 98 106*    Microbiology: Results for orders placed or performed in visit on 04/24/13  Influenza A&B Antigens [facility])     Status: None   Collection Time: 04/24/13  4:18 PM  Result Value Ref Range Status   Micro Text Report   Final       COMMENT                   NEGATIVE FOR INFLUENZA A (ANTIGEN ABSENT)   COMMENT                   NEGATIVE FOR INFLUENZA B (ANTIGEN ABSENT)   ANTIBIOTIC                                                      Culture, blood (single)     Status: None   Collection Time: 04/24/13  6:48 PM  Result Value Ref Range Status   Micro Text Report   Final       COMMENT                   NO GROWTH AEROBICALLY/ANAEROBICALLY IN 5 DAYS   ANTIBIOTIC                                                      Culture, blood (single)     Status: None   Collection Time: 04/24/13  6:48 PM  Result Value Ref Range Status   Micro Text Report   Final       COMMENT                   NO GROWTH AEROBICALLY/ANAEROBICALLY IN 5 DAYS   ANTIBIOTIC                                                        Coagulation Studies:  Recent Labs  01/24/15 2310  LABPROT 13.0  INR 0.96    Urinalysis: No results for input(s): COLORURINE, LABSPEC, PHURINE, GLUCOSEU, HGBUR, BILIRUBINUR, KETONESUR, PROTEINUR, UROBILINOGEN, NITRITE, LEUKOCYTESUR in the last 168 hours.  Invalid input(s): APPERANCEUR  Lipid Panel:     Component Value Date/Time   CHOL 217* 01/25/2015 0757   TRIG 99 01/25/2015 0757   HDL 76 01/25/2015 0757   CHOLHDL 2.9 01/25/2015 0757   VLDL 20 01/25/2015 0757   LDLCALC 121* 01/25/2015 0757    HgbA1C:  Lab Results  Component Value Date   HGBA1C 12.0* 01/25/2015    Urine Drug Screen:  No results found for: LABOPIA, COCAINSCRNUR, LABBENZ, AMPHETMU, THCU, LABBARB  Alcohol Level: No results for input(s): ETH in the last 168  hours.  Other  results: EKG: normal EKG, normal sinus rhythm, unchanged from previous tracings.  Imaging: Ct Head Wo Contrast  01/24/2015   CLINICAL DATA:  Right facial and right arm numbness and tingling with headache, weakness, and fatigue starting yesterday at 1400 hours.  EXAM: CT HEAD WITHOUT CONTRAST  TECHNIQUE: Contiguous axial images were obtained from the base of the skull through the vertex without intravenous contrast.  COMPARISON:  11/28/2007  FINDINGS: Mild diffuse cerebral atrophy. No significant ventricular dilatation. No mass effect or midline shift. No abnormal extra-axial fluid collections. Gray-white matter junctions are distinct. Basal cisterns are not effaced. No evidence of acute intracranial hemorrhage. No depressed skull fractures. Visualized paranasal sinuses and mastoid air cells are not opacified.  IMPRESSION: No acute intracranial abnormalities.  Mild diffuse atrophy.   Electronically Signed   By: Lucienne Capers M.D.   On: 01/24/2015 23:53   Mr Brain Wo Contrast  01/25/2015   CLINICAL DATA:  59 year old male with numbness and tingling of the right arm and face two days ago. Current history of diabetes which may be poorly controlled. Initial encounter.  EXAM: MRI HEAD WITHOUT CONTRAST  TECHNIQUE: Multiplanar, multiecho pulse sequences of the brain and surrounding structures were obtained without intravenous contrast.  COMPARISON:  Head CT without contrast 01/24/2015, and earlier.  FINDINGS: Normal cerebral volume. No restricted diffusion to suggest acute infarction. No midline shift, mass effect, evidence of mass lesion, ventriculomegaly, extra-axial collection or acute intracranial hemorrhage. Cervicomedullary junction and pituitary are within normal limits. Major intracranial vascular flow voids are preserved, the distal right vertebral artery appears dominant.  Scattered and patchy cerebral white matter T2 and FLAIR hyperintensity, mildly to moderately advanced for age. No  cortical encephalomalacia or chronic cerebral blood products. Deep gray matter nuclei, brainstem and cerebellum are within normal limits.  Noncontrast cavernous sinus appears normal. No skullbase abnormality identified. Paranasal sinuses are clear aside from trace left maxillary and ethmoid mucosal thickening. Noncontrast infraorbital nerves appear within normal limits. Mastoids are clear. Visible internal auditory structures appear normal. Orbit and scalp soft tissues appear normal. Normal bone marrow signal. Negative visualized cervical spine.  IMPRESSION: 1.  No acute intracranial abnormality. 2. Mildly to moderately advanced for age nonspecific white matter signal changes, most commonly due to chronic small vessel disease.   Electronically Signed   By: Genevie Ann M.D.   On: 01/25/2015 10:18   US Carotid Bilateral  01/25/2015   CLINICAL DATA:  59 year old male with a history of cerebral vascular accident.  Cardiovascular risk factors include diabetes.  EXAM: BILATERAL CAROTID DUPLEX ULTRASOUND  TECHNIQUE: Pearline Cables scale imaging, color Doppler and duplex ultrasound were performed of bilateral carotid and vertebral arteries in the neck.  COMPARISON:  No prior duplex  FINDINGS: Criteria: Quantification of carotid stenosis is based on velocity parameters that correlate the residual internal carotid diameter with NASCET-based stenosis levels, using the diameter of the distal internal carotid lumen as the denominator for stenosis measurement.  The following velocity measurements were obtained:  RIGHT  ICA:  Systolic 80 cm/sec, Diastolic 19 cm/sec  CCA:  270 cm/sec  SYSTOLIC ICA/CCA RATIO:  0.8  ECA:  106 cm/sec  LEFT  ICA:  Systolic 75 cm/sec, Diastolic 18 cm/sec  CCA:  350 cm/sec  SYSTOLIC ICA/CCA RATIO:  0.7  ECA:  97 cm/sec  Right Brachial SBP: Not acquired  Left Brachial SBP: Not acquired  RIGHT CAROTID ARTERY: No significant calcified disease of the right common carotid artery. Intermediate waveform maintained.  Heterogeneous plaque without significant calcifications at  the right carotid bifurcation. Low resistance waveform of the right ICA. No significant tortuosity.  RIGHT VERTEBRAL ARTERY: Antegrade flow with low resistance waveform.  LEFT CAROTID ARTERY: No significant calcified disease of the left common carotid artery. Intermediate waveform maintained. Heterogeneous plaque at the left carotid bifurcation without significant calcifications. Low resistance waveform of the left ICA.  LEFT VERTEBRAL ARTERY:  Antegrade flow with low resistance waveform.  IMPRESSION: Color duplex indicates minimal heterogeneous plaque, with no hemodynamically significant stenosis by duplex criteria in the extracranial cerebrovascular circulation.  Signed,  Dulcy Fanny. Earleen Newport, DO  [facility]  [facility]   Electronically Signed   By: Corrie Mckusick D.O.   On: 01/25/2015 13:33   US Venous Img Lower Unilateral Right  01/25/2015   CLINICAL DATA:  Right calf pain and calf mass felt by the patient.  EXAM: RIGHT LOWER EXTREMITY VENOUS DOPPLER ULTRASOUND  TECHNIQUE: Gray-scale sonography with graded compression, as well as color Doppler and duplex ultrasound were performed to evaluate the lower extremity deep venous systems from the level of the common femoral vein and including the common femoral, femoral, profunda femoral, popliteal and calf veins including the posterior tibial, peroneal and gastrocnemius veins when visible. The superficial great saphenous vein was also interrogated. Spectral Doppler was utilized to evaluate flow at rest and with distal augmentation maneuvers in the common femoral, femoral and popliteal veins.  COMPARISON:  None.  FINDINGS: Contralateral Common Femoral Vein: Respiratory phasicity is normal and symmetric with the symptomatic side. No evidence of thrombus. Normal compressibility.  Common Femoral Vein: No evidence of thrombus. Normal compressibility, respiratory  phasicity and response to augmentation.  Saphenofemoral Junction: No evidence of thrombus. Normal compressibility and flow on color Doppler imaging.  Profunda Femoral Vein: No evidence of thrombus. Normal compressibility and flow on color Doppler imaging.  Femoral Vein: No evidence of thrombus. Normal compressibility, respiratory phasicity and response to augmentation.  Popliteal Vein: No evidence of thrombus. Normal compressibility, respiratory phasicity and response to augmentation.  Calf Veins: No evidence of thrombus. Normal compressibility and flow on color Doppler imaging.  Superficial Great Saphenous Vein: No evidence of thrombus. Normal compressibility and flow on color Doppler imaging.  Venous Reflux:  None.  Other Findings: 1.9 x 1.8 x 1.0 cm oval, heterogeneous mass within the musculature of the upper right calf, corresponding to the location of pain and mass felt by the patient. No internal blood flow with color Doppler.  IMPRESSION: 1. 1.9 x 1.8 x 1.0 cm mass in the upper right calf musculature, corresponding to the location of pain and mass felt by the patient. Differential considerations include a hematoma due to a muscle tear. A neoplasm is also a possibility. These possibilities could be differentiated with pre and postcontrast magnetic resonance imaging of the calf. 2. No deep venous thrombosis.   Electronically Signed   By: Claudie Revering M.D.   On: 01/25/2015 03:54     Assessment/Plan:  59 y.o. male with history of DM  presents emergency department after experiencing changes in sensation of his right arm and right side of his face. 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  No acute abnormality on MRI of brain.  Ultrasound no hemodynamic stenosis  - Was on ASA 325, please continue it.  - DM management. Pt's baseline HbA1C is over 10 with regular glc running in the 250-300 range - Pt does seen Endocrinology and [facility] -d/c  planning  Leotis Pain   01/25/2015, 2:22 PM

## 2015-01-27 NOTE — Discharge Summary (Signed)
[place] [facility] [place] NAME: [name]    MR#:  [id number]  DATE OF BIRTH:  04-27-1956  DATE OF ADMISSION:  01/25/2015 ADMITTING PHYSICIAN: Harrie Foreman, MD  DATE OF DISCHARGE: 01/25/2015  5:34 PM  PRIMARY CARE PHYSICIAN: Pcp Not In System    ADMISSION DIAGNOSIS:  Hematoma [T14.8] Hyperglycemia [R73.9] Calf pain, right [M79.661] Transient cerebral ischemia, unspecified transient cerebral ischemia type [G45.9]  DISCHARGE DIAGNOSIS:  Active Problems:   TIA (transient ischemic attack)   SECONDARY DIAGNOSIS:   Past Medical History  Diagnosis Date  . Diabetes mellitus without complication      ADMITTING HISTORY  Chief Complaint: Numbness and tingling HPI: The patient presents emergency department after experiencing changes in sensation of his right arm and right side of his face. 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 The following day the patient was walking outside on a field trip with his class when the right side of his face began to tingle. All symptoms have resolved at this time. Occasionally the patient admits to feeling as though his gum has been "injected with Novocain". He denies any associated weakness, difficulty swallowing or speaking, or visual changes. He also denies chest pain, shortness of breath, nausea, vomiting or diaphoresis. Past medical history is significant for diabetes the patient admits is poorly controlled. Due to his risk factors the urgency partly staff called for admission.   HOSPITAL COURSE:   * Transient ischemic attack She was admitted onto medical floor with telemetry. No arrhythmias were found. MRI of the brain was done which showed no acute strokes. Carotid Dopplers and echocardiogram showed nothing acute. Started on aspirin and statin. Patient does have uncontrolled diabetes and hyperlipidemia but seemed to be  contributing. By the time of discharge patient's symptoms have resolved. Was seen by neurology.  * Uncontrolled insulin-dependent diabetes mellitus His Lantus dose has been increased. This will likely need to be titrated by his PCP as outpatient.  * Hyperlipidemia Started on statin.  Stable for discharge.   CONSULTS OBTAINED:  Treatment Team:  Leotis Pain, MD  DRUG ALLERGIES:  No Known Allergies  DISCHARGE MEDICATIONS:   Discharge Medication List as of 01/25/2015  4:17 PM    START taking these medications   Details  atorvastatin (LIPITOR) 40 MG tablet Take 1 tablet (40 mg total) by mouth daily., Starting 01/25/2015, Until Discontinued, Print      CONTINUE these medications which have CHANGED   Details  !! insulin detemir (LEVEMIR) 100 UNIT/ML injection Inject 0.2 mLs (20 Units total) into the skin every morning., Starting 01/25/2015, Until Discontinued, No Print    !! insulin detemir (LEVEMIR) 100 UNIT/ML injection Inject 0.18 mLs (18 Units total) into the skin at bedtime., Starting 01/25/2015, Until Discontinued, No Print     !! - Potential duplicate medications found. Please discuss with provider.    CONTINUE these medications which have NOT CHANGED   Details  aspirin 325 MG tablet Take 325 mg by mouth daily., Until Discontinued, Historical Med    insulin aspart (NOVOLOG) 100 UNIT/ML injection Inject into the skin 3 (three) times daily before meals. Sliding scale 1 unit per 10 carbs, Until Discontinued, Historical Med    Multiple Vitamin (MULTIVITAMIN WITH MINERALS) TABS tablet Take 1 tablet by mouth daily., Until Discontinued, Historical Med    Omega-3 Fatty Acids (FISH OIL PO) Take 1 capsule by mouth daily., Until Discontinued, Historical Med  Today    VITAL SIGNS:  Blood pressure 161/75, pulse 75, temperature 97.3 F (36.3 C), temperature source Axillary, resp. rate 20, height 5\' 3"  (1.6 m), weight 60.782 kg (134 lb), SpO2 98 %.  I/O:  No intake  or output data in the 24 hours ending 01/27/15 1422  PHYSICAL EXAMINATION:  Physical Exam  GENERAL:  59 y.o.-year-old patient lying in the bed with no acute distress.  LUNGS: Normal breath sounds bilaterally, no wheezing, rales,rhonchi or crepitation. No use of accessory muscles of respiration.  CARDIOVASCULAR: S1, S2 normal. No murmurs, rubs, or gallops.  ABDOMEN: Soft, non-tender, non-distended. Bowel sounds present. No organomegaly or mass.  NEUROLOGIC: Moves all 4 extremities. PSYCHIATRIC: The patient is alert and oriented x 3.  SKIN: No obvious rash, lesion, or ulcer.   DATA REVIEW:   CBC  Recent Labs Lab 01/24/15 2310  WBC 9.7  HGB 14.5  HCT 42.7  PLT 254    Chemistries   Recent Labs Lab 01/24/15 2310  NA 136  K 3.3*  CL 99*  CO2 27  GLUCOSE 393*  BUN 17  CREATININE 0.85  CALCIUM 9.8  AST 37  ALT 30  ALKPHOS 124  BILITOT 0.5    Cardiac Enzymes  Recent Labs Lab 01/24/15 2310  TROPONINI <0.03    Microbiology Results  Results for orders placed or performed in visit on 04/24/13  Influenza A&B Antigens [facility])     Status: None   Collection Time: 04/24/13  4:18 PM  Result Value Ref Range Status   Micro Text Report   Final       COMMENT                   NEGATIVE FOR INFLUENZA A (ANTIGEN ABSENT)   COMMENT                   NEGATIVE FOR INFLUENZA B (ANTIGEN ABSENT)   ANTIBIOTIC                                                      Culture, blood (single)     Status: None   Collection Time: 04/24/13  6:48 PM  Result Value Ref Range Status   Micro Text Report   Final       COMMENT                   NO GROWTH AEROBICALLY/ANAEROBICALLY IN 5 DAYS   ANTIBIOTIC                                                      Culture, blood (single)     Status: None   Collection Time: 04/24/13  6:48 PM  Result Value Ref Range Status   Micro Text Report   Final       COMMENT                   NO GROWTH AEROBICALLY/ANAEROBICALLY IN 5 DAYS   ANTIBIOTIC  RADIOLOGY:  No results found.    Follow up with PCP in 1 week.  Management plans discussed with the patient, family and they are in agreement.  CODE STATUS:  Advance Directive Documentation        Most Recent Value   Type of Advance Directive  Living will   Pre-existing out of facility DNR order (yellow form or pink MOST form)     "MOST" Form in Place?        TOTAL TIME TAKING CARE OF THIS PATIENT ON DAY OF DISCHARGE: more than 30 minutes.    Hillary Bow R M.D on 01/27/2015 at 2:22 PM  Between 7am to 6pm - Pager - [phone]  After 6pm go to [email or web address] - password [email or web address] [facility]  Office  [phone]  CC: Primary care physician; Pcp Not In System   Note: This dictation was prepared with Dragon dictation along with smaller phrase technology. Any transcriptional errors that result from this process are unintentional.

## 2017-03-08 ENCOUNTER — Encounter: Payer: Self-pay | Admitting: Emergency Medicine

## 2017-03-08 ENCOUNTER — Other Ambulatory Visit: Payer: Self-pay

## 2017-03-08 ENCOUNTER — Emergency Department: Payer: BC Managed Care – PPO

## 2017-03-08 ENCOUNTER — Ambulatory Visit (INDEPENDENT_AMBULATORY_CARE_PROVIDER_SITE_OTHER)
Admission: EM | Admit: 2017-03-08 | Discharge: 2017-03-08 | Disposition: A | Discharge status: Other Acute Inpatient Hospital | Payer: BC Managed Care – PPO | Source: Home / Self Care | Attending: Family Medicine | Admitting: Family Medicine

## 2017-03-08 ENCOUNTER — Ambulatory Visit (INDEPENDENT_AMBULATORY_CARE_PROVIDER_SITE_OTHER): Payer: BC Managed Care – PPO

## 2017-03-08 ENCOUNTER — Encounter: Payer: Self-pay | Admitting: *Deleted

## 2017-03-08 ENCOUNTER — Observation Stay
Admission: EM | Admit: 2017-03-08 | Discharge: 2017-03-09 | Disposition: A | Discharge status: Home / Self Care | Payer: BC Managed Care – PPO | Attending: Internal Medicine | Admitting: Internal Medicine

## 2017-03-08 DIAGNOSIS — D72829 Elevated white blood cell count, unspecified: Secondary | ICD-10-CM

## 2017-03-08 DIAGNOSIS — Z7982 Long term (current) use of aspirin: Secondary | ICD-10-CM | POA: Insufficient documentation

## 2017-03-08 DIAGNOSIS — R112 Nausea with vomiting, unspecified: Secondary | ICD-10-CM

## 2017-03-08 DIAGNOSIS — I7389 Other specified peripheral vascular diseases: Secondary | ICD-10-CM | POA: Insufficient documentation

## 2017-03-08 DIAGNOSIS — R202 Paresthesia of skin: Secondary | ICD-10-CM

## 2017-03-08 DIAGNOSIS — Z79899 Other long term (current) drug therapy: Secondary | ICD-10-CM | POA: Diagnosis not present

## 2017-03-08 DIAGNOSIS — E119 Type 2 diabetes mellitus without complications: Secondary | ICD-10-CM

## 2017-03-08 DIAGNOSIS — E785 Hyperlipidemia, unspecified: Secondary | ICD-10-CM | POA: Insufficient documentation

## 2017-03-08 DIAGNOSIS — Z87891 Personal history of nicotine dependence: Secondary | ICD-10-CM | POA: Insufficient documentation

## 2017-03-08 DIAGNOSIS — G3189 Other specified degenerative diseases of nervous system: Secondary | ICD-10-CM | POA: Diagnosis not present

## 2017-03-08 DIAGNOSIS — G459 Transient cerebral ischemic attack, unspecified: Principal | ICD-10-CM | POA: Insufficient documentation

## 2017-03-08 DIAGNOSIS — R42 Dizziness and giddiness: Secondary | ICD-10-CM

## 2017-03-08 DIAGNOSIS — R410 Disorientation, unspecified: Secondary | ICD-10-CM

## 2017-03-08 DIAGNOSIS — E109 Type 1 diabetes mellitus without complications: Secondary | ICD-10-CM | POA: Insufficient documentation

## 2017-03-08 DIAGNOSIS — Z794 Long term (current) use of insulin: Secondary | ICD-10-CM | POA: Diagnosis not present

## 2017-03-08 DIAGNOSIS — Z8249 Family history of ischemic heart disease and other diseases of the circulatory system: Secondary | ICD-10-CM | POA: Insufficient documentation

## 2017-03-08 DIAGNOSIS — R200 Anesthesia of skin: Secondary | ICD-10-CM

## 2017-03-08 HISTORY — DX: Transient cerebral ischemic attack, unspecified: G45.9

## 2017-03-08 LAB — COMPREHENSIVE METABOLIC PANEL
ALBUMIN: 4.5 g/dL (ref 3.5–5.0)
ALK PHOS: 136 U/L — AB (ref 38–126)
ALT: 37 U/L (ref 17–63)
ALT: 38 U/L (ref 17–63)
ANION GAP: 15 (ref 5–15)
AST: 28 U/L (ref 15–41)
AST: 35 U/L (ref 15–41)
Albumin: 4.8 g/dL (ref 3.5–5.0)
Alkaline Phosphatase: 131 U/L — ABNORMAL HIGH (ref 38–126)
Anion gap: 10 (ref 5–15)
BILIRUBIN TOTAL: 1.2 mg/dL (ref 0.3–1.2)
BUN: 13 mg/dL (ref 6–20)
BUN: 14 mg/dL (ref 6–20)
CALCIUM: 9.4 mg/dL (ref 8.9–10.3)
CO2: 28 mmol/L (ref 22–32)
CO2: 26 mmol/L (ref 22–32)
CREATININE: 0.79 mg/dL (ref 0.61–1.24)
Calcium: 9.5 mg/dL (ref 8.9–10.3)
Chloride: 96 mmol/L — ABNORMAL LOW (ref 101–111)
Chloride: 95 mmol/L — ABNORMAL LOW (ref 101–111)
Creatinine, Ser: 0.84 mg/dL (ref 0.61–1.24)
GFR calc non Af Amer: >60 mL/min (ref >60)
GLUCOSE: 246 mg/dL — AB (ref 65–99)
Glucose, Bld: 254 mg/dL — ABNORMAL HIGH (ref 65–99)
POTASSIUM: 4 mmol/L (ref 3.5–5.1)
Potassium: 3.8 mmol/L (ref 3.5–5.1)
Sodium: 134 mmol/L — ABNORMAL LOW (ref 135–145)
Sodium: 136 mmol/L (ref 135–145)
TOTAL PROTEIN: 8 g/dL (ref 6.5–8.1)
Total Bilirubin: 0.4 mg/dL (ref 0.3–1.2)
Total Protein: 7.7 g/dL (ref 6.5–8.1)

## 2017-03-08 LAB — CBC WITH DIFFERENTIAL/PLATELET
BASOS ABS: 0 10*3/uL (ref 0–0.1)
BASOS PCT: 0 %
Basophils Absolute: 0 10*3/uL (ref 0–0.1)
Basophils Relative: 0 %
EOS ABS: 0 10*3/uL (ref 0–0.7)
EOS PCT: 0 %
EOS PCT: 0 %
Eosinophils Absolute: 0 10*3/uL (ref 0–0.7)
HCT: 42.9 % (ref 40.0–52.0)
HEMATOCRIT: 45.1 % (ref 40.0–52.0)
Hemoglobin: 14.8 g/dL (ref 13.0–18.0)
Hemoglobin: 15.3 g/dL (ref 13.0–18.0)
LYMPHS PCT: 4 %
Lymphocytes Relative: 14 %
Lymphs Abs: 2.1 10*3/uL (ref 1.0–3.6)
Lymphs Abs: 0.7 10*3/uL — ABNORMAL LOW (ref 1.0–3.6)
MCH: 28.9 pg (ref 26.0–34.0)
MCH: 28.6 pg (ref 26.0–34.0)
MCHC: 34.5 g/dL (ref 32.0–36.0)
MCHC: 33.9 g/dL (ref 32.0–36.0)
MCV: 83.8 fL (ref 80.0–100.0)
MCV: 84.6 fL (ref 80.0–100.0)
MONO ABS: 0.6 10*3/uL (ref 0.2–1.0)
MONOS PCT: 4 %
Monocytes Absolute: 0.4 10*3/uL (ref 0.2–1.0)
Monocytes Relative: 3 %
NEUTROS ABS: 15.2 10*3/uL — AB (ref 1.4–6.5)
NEUTROS PCT: 93 %
Neutro Abs: 12 10*3/uL — ABNORMAL HIGH (ref 1.4–6.5)
Neutrophils Relative %: 82 %
PLATELETS: 276 10*3/uL (ref 150–440)
Platelets: 273 10*3/uL (ref 150–440)
RBC: 5.11 MIL/uL (ref 4.40–5.90)
RBC: 5.33 MIL/uL (ref 4.40–5.90)
RDW: 12.8 % (ref 11.5–14.5)
RDW: 13.1 % (ref 11.5–14.5)
WBC: 14.8 10*3/uL — ABNORMAL HIGH (ref 3.8–10.6)
WBC: 16.4 10*3/uL — AB (ref 3.8–10.6)

## 2017-03-08 LAB — URINALYSIS, COMPLETE (UACMP) WITH MICROSCOPIC
Bacteria, UA: NONE SEEN
Bilirubin Urine: NEGATIVE
HGB URINE DIPSTICK: NEGATIVE
Ketones, ur: 20 mg/dL — AB
Leukocytes, UA: NEGATIVE
Nitrite: NEGATIVE
Protein, ur: 100 mg/dL — AB
SPECIFIC GRAVITY, URINE: 1.032 — AB (ref 1.005–1.030)
SQUAMOUS EPITHELIAL / LPF: NONE SEEN
pH: 7 (ref 5.0–8.0)

## 2017-03-08 LAB — BETA-HYDROXYBUTYRIC ACID: Beta-Hydroxybutyric Acid: 0.67 mmol/L — ABNORMAL HIGH (ref 0.05–0.27)

## 2017-03-08 LAB — GLUCOSE, CAPILLARY
GLUCOSE-CAPILLARY: 238 mg/dL — AB (ref 65–99)
GLUCOSE-CAPILLARY: 207 mg/dL — AB (ref 65–99)
GLUCOSE-CAPILLARY: 255 mg/dL — AB (ref 65–99)
Glucose-Capillary: 252 mg/dL — ABNORMAL HIGH (ref 65–99)

## 2017-03-08 LAB — TROPONIN I: Troponin I: <0.03 ng/mL (ref <0.03)

## 2017-03-08 LAB — PROTIME-INR
INR: 0.98
Prothrombin Time: 12.9 seconds (ref 11.4–15.2)

## 2017-03-08 LAB — LACTIC ACID, PLASMA: Lactic Acid, Venous: 1.6 mmol/L (ref 0.5–1.9)

## 2017-03-08 MED ORDER — SODIUM CHLORIDE 0.9 % IV BOLUS (SEPSIS)
1000.0000 mL | Freq: Once | INTRAVENOUS | Status: AC
  Administered 2017-03-08: 1000 mL via INTRAVENOUS

## 2017-03-08 MED ORDER — MORPHINE SULFATE (PF) 4 MG/ML IV SOLN
4.0000 mg | Freq: Once | INTRAVENOUS | Status: AC
  Administered 2017-03-08: 4 mg via INTRAVENOUS
  Filled 2017-03-08: qty 1

## 2017-03-08 MED ORDER — ONDANSETRON 8 MG PO TBDP
8.0000 mg | ORAL_TABLET | Freq: Once | ORAL | Status: AC
  Administered 2017-03-08: 8 mg via ORAL

## 2017-03-08 MED ORDER — PROMETHAZINE HCL 25 MG/ML IJ SOLN
25.0000 mg | Freq: Once | INTRAMUSCULAR | Status: AC
  Administered 2017-03-08: 25 mg via INTRAMUSCULAR

## 2017-03-08 MED ORDER — ONDANSETRON HCL 4 MG/2ML IJ SOLN
4.0000 mg | Freq: Once | INTRAMUSCULAR | Status: AC
  Administered 2017-03-08: 4 mg via INTRAVENOUS
  Filled 2017-03-08: qty 2

## 2017-03-08 MED ORDER — ASPIRIN 81 MG PO CHEW
CHEWABLE_TABLET | ORAL | Status: AC
  Administered 2017-03-08: 324 mg via ORAL
  Filled 2017-03-08: qty 4

## 2017-03-08 MED ORDER — ASPIRIN 81 MG PO CHEW
324.0000 mg | CHEWABLE_TABLET | Freq: Once | ORAL | Status: AC
  Administered 2017-03-08: 324 mg via ORAL

## 2017-03-08 NOTE — ED Notes (Signed)
Patient transported to MRI 

## 2017-03-08 NOTE — H&P (Signed)
[facility] [place] NAME: [name]    MR#:  [id number]  DATE OF BIRTH:  [birth year]  DATE OF ADMISSION:  03/08/2017  PRIMARY CARE PHYSICIAN: System, Pcp Not In   REQUESTING/REFERRING PHYSICIAN: Jacqualine Code, MD  CHIEF COMPLAINT:   Chief Complaint  Patient presents with  . Headache  . Emesis  . Altered Mental Status    HISTORY OF PRESENT ILLNESS:  [name]  is a 61 y.o. male who presents with an episode of left hand tingling, dizziness, subsequent headache, subsequent nausea and vomiting.  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  His blood sugar was 250 however when he checked it.  He then had left hand tingling and numbness.  He then had nausea with vomiting.  He decided to come to the ED for evaluation.  Workup here initially was largely within normal limits, but story concerning for possible stroke, even though his symptoms largely resolved while in the ED.  Hospitalist were called for admission.  PAST MEDICAL HISTORY:   Past Medical History:  Diagnosis Date  . Diabetes mellitus without complication ([place])   . TIA (transient ischemic attack)     PAST SURGICAL HISTORY:   Past Surgical History:  Procedure Laterality Date  . NO PAST SURGERIES      SOCIAL HISTORY:   Social History   Tobacco Use  . Smoking status: Former Smoker    Types: Cigarettes  . Smokeless tobacco: Never Used  Substance Use Topics  . Alcohol use: Yes    Comment: mixed drink 1-2 times per month    FAMILY HISTORY:   Family History  Problem Relation Age of Onset  . Heart attack Father   . Stroke Paternal Grandfather     DRUG ALLERGIES:  No Known Allergies  MEDICATIONS AT HOME:   Prior to Admission medications   Medication Sig Start Date End Date Taking? Authorizing Provider  aspirin 325 MG tablet Take 325 mg by mouth daily.   Yes [provider]  atorvastatin (LIPITOR) 40 MG tablet Take 1 tablet (40 mg total) by mouth daily. 01/25/15  Yes Sudini, Alveta Heimlich, MD  insulin aspart (NOVOLOG) 100 UNIT/ML injection Inject into the skin 3 (three) times daily before meals. Sliding scale 1 unit per 10 carbs   Yes [provider]  insulin detemir (LEVEMIR) 100 UNIT/ML injection Inject 0.2 mLs (20 Units total) into the skin every morning. 01/25/15  Yes Sudini, Srikar, MD  insulin detemir (LEVEMIR) 100 UNIT/ML injection Inject 0.18 mLs (18 Units total) into the skin at bedtime. 01/25/15  Yes Hillary Bow, MD  Multiple Vitamin (MULTIVITAMIN WITH MINERALS) TABS tablet Take 1 tablet by mouth daily.   Yes [provider]  Omega-3 Fatty Acids (FISH OIL PO) Take 1 capsule by mouth daily.   Yes [provider]    REVIEW OF SYSTEMS:  Review of Systems  Constitutional: Negative for chills, fever, malaise/fatigue and weight loss.  HENT: Negative for ear pain, hearing loss and tinnitus.   Eyes: Negative for blurred vision, double vision, pain and redness.  Respiratory: Negative for cough, hemoptysis and shortness of breath.   Cardiovascular: Negative for chest pain, palpitations, orthopnea and leg swelling.  Gastrointestinal: Positive for nausea and vomiting. Negative for abdominal pain, constipation and diarrhea.  Genitourinary: Negative for dysuria, frequency and hematuria.  Musculoskeletal: Negative for back pain, joint pain and neck pain.  Skin:  No acne, rash, or lesions  Neurological: Positive for dizziness and sensory change. Negative for tremors, focal weakness and weakness.  Endo/Heme/Allergies: Negative for polydipsia. Does not bruise/bleed easily.  Psychiatric/Behavioral: Negative for depression. The patient is not nervous/anxious and does not have insomnia.      VITAL SIGNS:   Vitals:   03/08/17 1830 03/08/17 2200 03/08/17 2237  BP: (!) 166/82 (!) 159/81   Pulse: 93 95   Resp: 18 14   Temp: 98.5 F (36.9  C)  98.5 F (36.9 C)  TempSrc: Oral    SpO2: 95% 96%   Weight: 63.5 kg (140 lb)    Height: 5\' 3"  (1.6 m)     Wt Readings from Last 3 Encounters:  03/08/17 63.5 kg (140 lb)  03/08/17 65.8 kg (145 lb)  01/25/15 60.8 kg (134 lb)    PHYSICAL EXAMINATION:  Physical Exam  Vitals reviewed. Constitutional: He is oriented to person, place, and time. He appears well-developed and well-nourished. No distress.  HENT:  Head: Normocephalic and atraumatic.  Mouth/Throat: Oropharynx is clear and moist.  Eyes: Conjunctivae and EOM are normal. Pupils are equal, round, and reactive to light. No scleral icterus.  Neck: Normal range of motion. Neck supple. No JVD present. No thyromegaly present.  Cardiovascular: Normal rate, regular rhythm and intact distal pulses. Exam reveals no gallop and no friction rub.  Murmur heard. Respiratory: Effort normal and breath sounds normal. No respiratory distress. He has no wheezes. He has no rales.  GI: Soft. Bowel sounds are normal. He exhibits no distension. There is no tenderness.  Musculoskeletal: Normal range of motion. He exhibits no edema.  No arthritis, no gout  Lymphadenopathy:    He has no cervical adenopathy.  Neurological: He is alert and oriented to person, place, and time. No cranial nerve deficit.  No focal neurological deficit  Skin: Skin is warm and dry. No rash noted. No erythema.  Psychiatric: He has a normal mood and affect. His behavior is normal. Judgment and thought content normal.    LABORATORY PANEL:   CBC Recent Labs  Lab 03/08/17 2133  WBC 16.4*  HGB 15.3  HCT 45.1  PLT 276   ------------------------------------------------------------------------------------------------------------------  Chemistries  Recent Labs  Lab 03/08/17 2133  NA 136  K 4.0  CL 95*  CO2 26  GLUCOSE 254*  BUN 14  CREATININE 0.84  CALCIUM 9.5  AST 35  ALT 38  ALKPHOS 131*  BILITOT 1.2    ------------------------------------------------------------------------------------------------------------------  Cardiac Enzymes Recent Labs  Lab 03/08/17 2133  TROPONINI <0.03   ------------------------------------------------------------------------------------------------------------------  RADIOLOGY:  Dg Chest 2 View  Result Date: 03/08/2017 CLINICAL DATA:  Nausea, vomiting and confusion. EXAM: CHEST  2 VIEW COMPARISON:  04/24/2013 FINDINGS: The cardiomediastinal silhouette is unremarkable. There is no evidence of focal airspace disease, pulmonary edema, suspicious pulmonary nodule/mass, pleural effusion, or pneumothorax. No acute bony abnormalities are identified. IMPRESSION: No active cardiopulmonary disease. Electronically Signed   By: Margarette Canada M.D.   On: 03/08/2017 16:38   Ct Head Wo Contrast  Result Date: 03/08/2017 CLINICAL DATA:  Initial evaluation for acute severe headache. EXAM: CT HEAD WITHOUT CONTRAST TECHNIQUE: Contiguous axial images were obtained from the base of the skull through the vertex without intravenous contrast. COMPARISON:  Prior CT from 01/24/2015. FINDINGS: Brain: Age related cerebral atrophy. Mild chronic small vessel ischemic disease. No acute intracranial hemorrhage. Specifically, no subarachnoid hemorrhage identified. No acute large vessel territory infarct. No mass lesion, midline shift or mass effect. No hydrocephalus. No extra-axial  fluid collection. Vascular: No worrisome hyperdense vessel. Scattered vascular calcifications noted within the carotid siphons. Skull: Scalp soft tissues and calvarium within normal limits. Sinuses/Orbits: Globes and orbital soft tissues within normal limits. Visualized paranasal sinuses and mastoid air cells are clear. Other: None. IMPRESSION: 1. No acute intracranial abnormality. 2. Mild atrophy with chronic small vessel ischemic disease. Electronically Signed   By: Jeannine Boga M.D.   On: 03/08/2017 19:17   Mr  Brain Wo Contrast  Result Date: 03/08/2017 CLINICAL DATA:  Headache, nausea and vomiting.  TIA. EXAM: MRI HEAD WITHOUT CONTRAST TECHNIQUE: Multiplanar, multiecho pulse sequences of the brain and surrounding structures were obtained without intravenous contrast. COMPARISON:  Head CT 1178 seen FINDINGS: Brain: The midline structures are normal. There is no acute infarct or acute hemorrhage. No mass lesion, hydrocephalus, dural abnormality or extra-axial collection. There is multifocal white matter hyperintensity suggesting chronic ischemic microangiopathy. No age-advanced or lobar predominant atrophy. No chronic microhemorrhage or superficial siderosis. Vascular: Major intracranial arterial and venous sinus flow voids are preserved. Skull and upper cervical spine: The visualized skull base, calvarium, upper cervical spine and extracranial soft tissues are normal. Sinuses/Orbits: No fluid levels or advanced mucosal thickening. No mastoid or middle ear effusion. Normal orbits. IMPRESSION: Mild findings of chronic ischemic microangiopathy without acute intracranial abnormality. Electronically Signed   By: Ulyses Jarred M.D.   On: 03/08/2017 23:35    EKG:   Orders placed or performed during the hospital encounter of 03/08/17  . ED EKG 12-Lead  . ED EKG 12-Lead  . EKG 12-Lead  . EKG 12-Lead    IMPRESSION AND PLAN:  Principal Problem:   Numbness and tingling in left hand -concerning for TIA versus stroke.  MRI ordered, admit per stroke admission order set with appropriate imaging, labs, and consults Active Problems:   Dizziness -suspect due to whatever neurologic deficit he has experienced, workup as above   Nausea & vomiting -PRN antiemetics, suspect due to above   Diabetes ([place]) -sliding scale insulin with corresponding glucose checks  All the records are reviewed and case discussed with ED provider. Management plans discussed with the patient and/or family.  DVT PROPHYLAXIS: SubQ lovenox  GI  PROPHYLAXIS: None  ADMISSION STATUS: Observation  CODE STATUS: Full Code Status History    Date Active Date Inactive Code Status Order ID Comments User Context   01/25/2015 06:45 01/25/2015 20:35 Full Code [id number]  Harrie Foreman, MD Inpatient      TOTAL TIME TAKING CARE OF THIS PATIENT: 40 minutes.   Ellyse Rotolo DeWitt 03/08/2017, 11:49 PM  [facility]  Office  [phone]  CC: Primary care physician; System, Pcp Not In  Note:  This document was prepared using Dragon voice recognition software and may include unintentional dictation errors.

## 2017-03-08 NOTE — ED Notes (Signed)
POV from [place] with unrelieved vomiting, administered zofran and phenergan at [facility] with no improvement, sent here for further eval

## 2017-03-08 NOTE — ED Notes (Signed)
FSBS checked at pt & wife request

## 2017-03-08 NOTE — ED Provider Notes (Signed)
[facility]    CSN: [id number] Arrival date & time: 03/08/17  1455     History   Chief Complaint Chief Complaint  Patient presents with  . Emesis  . Dizziness    HPI [name] is a 61 y.o. male.   61 yo male type 1 diabetic with a c/o nausea and vomiting since this 11am. Has vomited 4-5 times since then. Now also feels generally weak. States he had a normal BM this morning and denies any abdominal pain, fevers, chills, chest pain, shortness of breath.    The history is provided by the patient.  Emesis  Dizziness  Associated symptoms: vomiting     Past Medical History:  Diagnosis Date  . Diabetes mellitus without complication [facility])     Patient Active Problem List   Diagnosis Date Noted  . TIA (transient ischemic attack) 01/25/2015    History reviewed. No pertinent surgical history.     Home Medications    Prior to Admission medications   Medication Sig Start Date End Date Taking? Authorizing Provider  aspirin 325 MG tablet Take 325 mg by mouth daily.   Yes [provider]  insulin aspart (NOVOLOG) 100 UNIT/ML injection Inject into the skin 3 (three) times daily before meals. Sliding scale 1 unit per 10 carbs   Yes [provider]  insulin detemir (LEVEMIR) 100 UNIT/ML injection Inject 0.2 mLs (20 Units total) into the skin every morning. 01/25/15  Yes Sudini, Srikar, MD  insulin detemir (LEVEMIR) 100 UNIT/ML injection Inject 0.18 mLs (18 Units total) into the skin at bedtime. 01/25/15  Yes Hillary Bow, MD  Multiple Vitamin (MULTIVITAMIN WITH MINERALS) TABS tablet Take 1 tablet by mouth daily.   Yes [provider]  Omega-3 Fatty Acids (FISH OIL PO) Take 1 capsule by mouth daily.   Yes [provider]  atorvastatin (LIPITOR) 40 MG tablet Take 1 tablet (40 mg total) by mouth daily. 01/25/15   Hillary Bow, MD    Family History History reviewed. No pertinent family history.  Social History Social History     Tobacco Use  . Smoking status: Former Smoker    Types: Cigarettes  . Smokeless tobacco: Never Used  Substance Use Topics  . Alcohol use: Yes    Comment: mixed drink 1-2 times per month  . Drug use: No     Allergies   Patient has no known allergies.   Review of Systems Review of Systems  Gastrointestinal: Positive for vomiting.  Neurological: Positive for dizziness.     Physical Exam Triage Vital Signs Troy Triage Vitals  Enc Vitals Group     BP 03/08/17 1521 (!) 176/89     Pulse Rate 03/08/17 1521 85     Resp 03/08/17 1521 18     Temp 03/08/17 1521 98 F (36.7 C)     Temp Source 03/08/17 1521 Oral     SpO2 03/08/17 1521 99 %     Weight 03/08/17 1509 145 lb (65.8 kg)     Height 03/08/17 1509 5\' 3"  (1.6 m)     Head Circumference --      Peak Flow --      Pain Score 03/08/17 1510 0     Pain Loc --      Pain Edu? --      Excl. in [place]? --    No data found.  Updated Vital Signs BP (!) 176/89 (BP Location: Left Arm)   Pulse 85   Temp 98 F (  36.7 C) (Oral)   Resp 18   Ht 5\' 3"  (1.6 m)   Wt 145 lb (65.8 kg)   SpO2 99%   BMI 25.69 kg/m   Visual Acuity Right Eye Distance:   Left Eye Distance:   Bilateral Distance:    Right Eye Near:   Left Eye Near:    Bilateral Near:     Physical Exam  Constitutional: He is oriented to person, place, and time. He appears well-developed and well-nourished. No distress.  HENT:  Head: Normocephalic and atraumatic.  Cardiovascular: Normal rate, regular rhythm, normal heart sounds and intact distal pulses.  No murmur heard. Pulmonary/Chest: Effort normal and breath sounds normal. No respiratory distress. He has no wheezes. He has no rales.  Abdominal: Soft. He exhibits no distension and no mass. Bowel sounds are decreased. There is no tenderness. There is no rebound and no guarding.  Neurological: He is alert and oriented to person, place, and time.  Skin: No rash noted. He is not diaphoretic.  Nursing note and vitals  reviewed.    UC Treatments / Results  Labs (all labs ordered are listed, but only abnormal results are displayed) Labs Reviewed  GLUCOSE, CAPILLARY - Abnormal; Notable for the following components:      Result Value   Glucose-Capillary 252 (*)    All other components within normal limits  COMPREHENSIVE METABOLIC PANEL - Abnormal; Notable for the following components:   Sodium 134 (*)    Chloride 96 (*)    Glucose, Bld 246 (*)    Alkaline Phosphatase 136 (*)    All other components within normal limits  CBC WITH DIFFERENTIAL/PLATELET - Abnormal; Notable for the following components:   WBC 14.8 (*)    Neutro Abs 12.0 (*)    All other components within normal limits  GLUCOSE, CAPILLARY - Abnormal; Notable for the following components:   Glucose-Capillary 238 (*)    All other components within normal limits  CBG MONITORING, Troy    EKG  EKG Interpretation None       Radiology Dg Chest 2 View  Result Date: 03/08/2017 CLINICAL DATA:  Nausea, vomiting and confusion. EXAM: CHEST  2 VIEW COMPARISON:  04/24/2013 FINDINGS: The cardiomediastinal silhouette is unremarkable. There is no evidence of focal airspace disease, pulmonary edema, suspicious pulmonary nodule/mass, pleural effusion, or pneumothorax. No acute bony abnormalities are identified. IMPRESSION: No active cardiopulmonary disease. Electronically Signed   By: Margarette Canada M.D.   On: 03/08/2017 16:38    Procedures Procedures (including critical care time)  Medications Ordered in UC Medications  ondansetron (ZOFRAN-ODT) disintegrating tablet 8 mg (8 mg Oral Given 03/08/17 1523)  promethazine (PHENERGAN) injection 25 mg (25 mg Intramuscular Given 03/08/17 1606)     Initial Impression / Assessment and Plan / UC Course  I have reviewed the triage vital signs and the nursing ns.  Pertinent labs & imaging results that were available during my care of the patient were reviewed by me and considered in my medical decision  making (see chart for details).      Final Clinical Impressions(s) / UC Diagnoses   Final diagnoses:  Intractable vomiting with nausea, unspecified vomiting type  Leukocytosis, unspecified type    Troy Discharge Orders    None     1. Labs/x-ray/ekg results and diagnosis reviewed with patient 2. Patient given zofran odt and phenergan IM without improvement; continues vomiting 3. Recommend patient go to Emergency Department for further evaluation and management ; report called to [facility] [place] triage  nurse  Controlled Substance Prescriptions [place] Controlled Substance Registry consulted? Not Applicable   Norval Gable, MD 03/08/17 [phone]

## 2017-03-08 NOTE — Discharge Instructions (Signed)
Recommend patient go to Emergency Department for further evaluation and management °

## 2017-03-08 NOTE — ED Triage Notes (Signed)
Patient started having symptoms of nausea, vomiting and confusion today at 1100. This AM patient reports no symptoms.

## 2017-03-08 NOTE — ED Triage Notes (Signed)
Patient presents to the ED with some confusion, severe headache, nausea and vomiting that began around noon today.  Patient's wife states that patient originally thought his blood sugar was low but it was 241.  Patient is alert and oriented x 4 but wife states patient was confused when coming to the ED from Urgent Care.  Patient was sent due to elevated WBC.  Patient denies any falls, or head trauma.  Patient reports feeling very cold and is mumbling slightly and prefers to keep his eyes closed.  Patient denies photophobia. Patient's wife states his speech changed after getting phenergan at the Urgent Care.

## 2017-03-08 NOTE — ED Provider Notes (Signed)
[facility] Emergency Department Provider Note   ____________________________________________   First MD Initiated Contact with Patient 03/08/17 2106     (approximate)  I have reviewed the triage vital signs and the nursing notes.   HISTORY  Chief Complaint Headache; Emesis; and Altered Mental Status    HPI [name] is a 61 y.o. 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  He started to feel nauseated and then vomited.  He was evaluated and called for EMS, but decision was made that he was feeling slightly better.  He then was driving home after class and began experiencing nausea once again and vomited.  He went to urgent care to be evaluated, at that time he was evaluated and told his white blood cell count was slightly high.  They recommended he come to the ER for evaluation after they given him nausea medication.  Reports after receiving a shot of nausea medicine he began experiencing slight feeling of dizziness, and also a headache located over the right frontal region.  No numbness or tingling.  No weakness.  Wife reports that around 1 PM he seemed confused, his left arm seems slightly weak at that time but that went away and he did not want to listen to her commands for a brief period.  They thought his sugar was low, but checked it and it was in the 240s.    At present, no ongoing confusion.  Believes the medicine he got for nausea at urgent care made him feel slightly lightheaded, made his eyes feel twitching, and I have given him a headache.  Wife reports he had "DKA" in the past and is felt similar  No recent illness or tick bites.  He has had a slight cough today, but reports he had a chest x-ray done in urgent care.  No fevers or chills he is aware of.  Denies any neck pain or stiffness.  Past Medical  History:  Diagnosis Date  . Diabetes mellitus without complication ([place])   . TIA (transient ischemic attack)     Patient Active Problem List   Diagnosis Date Noted  . Numbness and tingling in left hand 03/08/2017  . Dizziness 03/08/2017  . Nausea & vomiting 03/08/2017  . Diabetes ([place]) 03/08/2017  . TIA (transient ischemic attack) 01/25/2015    Past Surgical History:  Procedure Laterality Date  . NO PAST SURGERIES      Prior to Admission medications   Medication Sig Start Date End Date Taking? Authorizing Provider  aspirin 325 MG tablet Take 325 mg by mouth daily.   Yes [provider]  atorvastatin (LIPITOR) 40 MG tablet Take 1 tablet (40 mg total) by mouth daily. 01/25/15  Yes Sudini, Alveta Heimlich, MD  insulin aspart (NOVOLOG) 100 UNIT/ML injection Inject into the skin 3 (three) times daily before meals. Sliding scale 1 unit per 10 carbs   Yes [provider]  insulin detemir (LEVEMIR) 100 UNIT/ML injection Inject 0.2 mLs (20 Units total) into the skin every morning. 01/25/15  Yes Sudini, Srikar, MD  insulin detemir (LEVEMIR) 100 UNIT/ML injection Inject 0.18 mLs (18 Units total) into the skin at bedtime. 01/25/15  Yes Hillary Bow, MD  Multiple Vitamin (MULTIVITAMIN WITH MINERALS) TABS tablet Take 1 tablet by mouth daily.   Yes [provider]  Omega-3 Fatty Acids (FISH OIL PO) Take  1 capsule by mouth daily.   Yes [provider]    Allergies Patient has no known allergies.  Family History  Problem Relation Age of Onset  . Heart attack Father   . Stroke Paternal Grandfather     Social History Social History   Tobacco Use  . Smoking status: Former Smoker    Types: Cigarettes  . Smokeless tobacco: Never Used  Substance Use Topics  . Alcohol use: Yes    Comment: mixed drink 1-2 times per month  . Drug use: No    Review of Systems Constitutional: No fever/chills Eyes: No visual changes except he feels like his eyes have been slightly  twitchy since given nausea medicine in urgent care. ENT: No sore throat. Cardiovascular: Denies chest pain. Respiratory: Denies shortness of breath.  Slight dry cough Gastrointestinal: No abdominal pain.  No diarrhea.  No constipation. Genitourinary: Negative for dysuria. Musculoskeletal: Negative for back pain. Skin: Negative for rash. Neurological: Negative for focal weakness or numbness.  Some confusion earlier around 1 PM and is gone away    ____________________________________________   PHYSICAL EXAM:  VITAL SIGNS: ED Triage Vitals  Enc Vitals Group     BP 03/08/17 1830 (!) 166/82     Pulse Rate 03/08/17 1830 93     Resp 03/08/17 1830 18     Temp 03/08/17 1830 98.5 F (36.9 C)     Temp Source 03/08/17 1830 Oral     SpO2 03/08/17 1830 95 %     Weight 03/08/17 1830 140 lb (63.5 kg)     Height 03/08/17 1830 5\' 3"  (1.6 m)     Head Circumference --      Peak Flow --      Pain Score 03/08/17 1838 6     Pain Loc --      Pain Edu? --      Excl. in [place]? --     Constitutional: Alert and oriented.  Appears mildly ill, prefers to keep his eyes closed and reports that he feels slight nausea.  Denies abdominal pain.  Reports he had a throbbing headache, but this seems to have gone away somewhat. Eyes: Conjunctivae are normal.  No photophobia.  Some lateral gaze nystagmus is elicitable. Head: Atraumatic.  No meningismus. Nose: No congestion/rhinnorhea. Mouth/Throat: Mucous membranes are slightly dry. Neck: No stridor.   Cardiovascular: Normal rate, regular rhythm. Grossly normal heart sounds.  Good peripheral circulation. Respiratory: Normal respiratory effort.  No retractions. Lungs CTAB. Gastrointestinal: Soft and nontender. No distention.  No rebound or guarding.  No tenderness in any quadrant. Musculoskeletal: No lower extremity tenderness nor edema. Neurologic:  Normal speech and language. No gross focal neurologic deficits are appreciated.   The patient has no pronator  drift. The patient has normal cranial nerve exam. Extraocular movements are slightly abnormal with some occasional lateral nystagmus. Visual fields are normal. Patient has 5 out of 5 strength in all extremities. There is no numbness or gross, acute sensory abnormality in the extremities bilaterally. No speech disturbance. No dysarthria. No aphasia. No ataxia. Normal finger nose finger bilat. Patient speaking in full and clear sentences.  Skin:  Skin is warm, dry and intact. No rash noted. Psychiatric: Mood and affect are normal. Speech and behavior are normal.  ____________________________________________   LABS (all labs ordered are listed, but only abnormal results are displayed)  Labs Reviewed  URINALYSIS, COMPLETE (UACMP) WITH MICROSCOPIC - Abnormal; Notable for the following components:      Result Value  Color, Urine YELLOW (*)    APPearance HAZY (*)    Specific Gravity, Urine 1.032 (*)    Glucose, UA >=500 (*)    Ketones, ur 20 (*)    Protein, ur 100 (*)    All other components within normal limits  GLUCOSE, CAPILLARY - Abnormal; Notable for the following components:   Glucose-Capillary 207 (*)    All other components within normal limits  GLUCOSE, CAPILLARY - Abnormal; Notable for the following components:   Glucose-Capillary 255 (*)    All other components within normal limits  COMPREHENSIVE METABOLIC PANEL - Abnormal; Notable for the following components:   Chloride 95 (*)    Glucose, Bld 254 (*)    Alkaline Phosphatase 131 (*)    All other components within normal limits  CBC WITH DIFFERENTIAL/PLATELET - Abnormal; Notable for the following components:   WBC 16.4 (*)    Neutro Abs 15.2 (*)    Lymphs Abs 0.7 (*)    All other components within normal limits  BLOOD GAS, VENOUS - Abnormal; Notable for the following components:   Bicarbonate 31.0 (*)    Acid-Base Excess 4.9 (*)    All other components within normal limits  BETA-HYDROXYBUTYRIC ACID - Abnormal;  Notable for the following components:   Beta-Hydroxybutyric Acid 0.67 (*)    All other components within normal limits  CULTURE, BLOOD (ROUTINE X 2)  CULTURE, BLOOD (ROUTINE X 2)  TROPONIN I  LACTIC ACID, PLASMA  PROTIME-INR  CBG MONITORING, ED  CBG MONITORING, ED   ____________________________________________  EKG  ED ECG REPORT I, Cyle Kenyon, the attending physician, personally viewed and interpreted this ECG.  Date: 03/08/2017 EKG Time: 2130 Rate: 95 Rhythm: normal sinus rhythm QRS Axis: normal Intervals: normal ST/T Wave abnormalities: normal Narrative Interpretation: no evidence of acute ischemia  ____________________________________________  RADIOLOGY  Dg Chest 2 View  Result Date: 03/08/2017 CLINICAL DATA:  Nausea, vomiting and confusion. EXAM: CHEST  2 VIEW COMPARISON:  04/24/2013 FINDINGS: The cardiomediastinal silhouette is unremarkable. There is no evidence of focal airspace disease, pulmonary edema, suspicious pulmonary nodule/mass, pleural effusion, or pneumothorax. No acute bony abnormalities are identified. IMPRESSION: No active cardiopulmonary disease. Electronically Signed   By: Margarette Canada M.D.   On: 03/08/2017 16:38   Ct Head Wo Contrast  Result Date: 03/08/2017 CLINICAL DATA:  Initial evaluation for acute severe headache. EXAM: CT HEAD WITHOUT CONTRAST TECHNIQUE: Contiguous axial images were obtained from the base of the skull through the vertex without intravenous contrast. COMPARISON:  Prior CT from 01/24/2015. FINDINGS: Brain: Age related cerebral atrophy. Mild chronic small vessel ischemic disease. No acute intracranial hemorrhage. Specifically, no subarachnoid hemorrhage identified. No acute large vessel territory infarct. No mass lesion, midline shift or mass effect. No hydrocephalus. No extra-axial fluid collection. Vascular: No worrisome hyperdense vessel. Scattered vascular calcifications noted within the carotid siphons. Skull: Scalp soft tissues  and calvarium within normal limits. Sinuses/Orbits: Globes and orbital soft tissues within normal limits. Visualized paranasal sinuses and mastoid air cells are clear. Other: None. IMPRESSION: 1. No acute intracranial abnormality. 2. Mild atrophy with chronic small vessel ischemic disease. Electronically Signed   By: Jeannine Boga M.D.   On: 03/08/2017 19:17   Mr Brain Wo Contrast  Result Date: 03/08/2017 CLINICAL DATA:  Headache, nausea and vomiting.  TIA. EXAM: MRI HEAD WITHOUT CONTRAST TECHNIQUE: Multiplanar, multiecho pulse sequences of the brain and surrounding structures were obtained without intravenous contrast. COMPARISON:  Head CT 1178 seen FINDINGS: Brain: The midline structures are normal.  There is no acute infarct or acute hemorrhage. No mass lesion, hydrocephalus, dural abnormality or extra-axial collection. There is multifocal white matter hyperintensity suggesting chronic ischemic microangiopathy. No age-advanced or lobar predominant atrophy. No chronic microhemorrhage or superficial siderosis. Vascular: Major intracranial arterial and venous sinus flow voids are preserved. Skull and upper cervical spine: The visualized skull base, calvarium, upper cervical spine and extracranial soft tissues are normal. Sinuses/Orbits: No fluid levels or advanced mucosal thickening. No mastoid or middle ear effusion. Normal orbits. IMPRESSION: Mild findings of chronic ischemic microangiopathy without acute intracranial abnormality. Electronically Signed   By: Ulyses Jarred M.D.   On: 03/08/2017 23:35     Reviewed chest x-ray from urgent care, no acute disease.  Reviewed CT of the head done today in the ER, no acute abnormality noted. ____________________________________________   PROCEDURES  Procedure(s) performed: None  Procedures  Critical Care performed: No  ____________________________________________   INITIAL IMPRESSION / ASSESSMENT AND PLAN / ED COURSE  Pertinent labs & imaging  results that were available during my care of the patient were reviewed by me and considered in my medical decision making (see chart for details).  Patient presents for evaluation after feeling lightheaded, then developing nausea and vomiting.  Went to urgent care, was referred to the ER for further evaluation.  Afebrile with reassuring examination at this time, wife reports he had an episode of confusion which now seem to be improved.  Patient does however report a moderate headache, but no further vomiting.  No ongoing abdominal pain.  No chest pain shortness of breath or trouble breathing.  Reassuring hemodynamics and examination neurologic abdominal cardiac and pulmonary exam at this time.  Slightly unclear as the cause, some of the symptoms sound slightly systemic in nature and possibly infectious though he does not have a fever, but he does have an elevated white count.    ----------------------------------------- 10:39 PM on 03/08/2017 -----------------------------------------  Patient reports he is feeling better.  His headache has resolved.  No ongoing symptoms or nausea at this time.  Of note, he does report that sometime about 1 AM he did notice that the first symptom is now recalling that his left hand felt numb and slightly abnormal, this lasted a brief period and then was followed by or started about the same time is feeling a feeling of dizziness.  He then began to have nausea and vomiting.  Thus far as test here did not demonstrate an obvious infectious etiology, he is reporting improvement, has no meningismus, and he has no signs or symptoms that suggest encephalitis/meningitis, or CNS infection.  His lab work thus far is reassuring with no evidence of DKA.  In review of his symptoms and given the associated dizziness and numbness in the left hand I am concerned this could have represented some type of a TIA or less likely a small stroke.  He is well outside the TPA window at the time  of presentation, and has no evidence to support a LVO.  His van scale is negative as he has no weakness.  Discussed with the patient is wife, I will admit him to the hospitalist service for further workup and have ordered a noncontrast MRI of the brain at this time.  ----------------------------------------- 11:50 PM on 03/08/2017 -----------------------------------------  Patient being admitted, ongoing ER care transferred to Dr. Beather Arbour.  MRI of the brain pending as well as admission at this time.  ____________________________________________   FINAL CLINICAL IMPRESSION(S) / ED DIAGNOSES  Final diagnoses:  Paresthesia  of left arm  Confusion and disorientation  Non-intractable vomiting with nausea, unspecified vomiting type      NEW MEDICATIONS STARTED DURING THIS VISIT:  This SmartLink is deprecated. Use AVSMEDLIST instead to display the medication list for a patient.   Note:  This document was prepared using Dragon voice recognition software and may include unintentional dictation errors.     Delman Kitten, MD 03/08/17 2351

## 2017-03-09 ENCOUNTER — Other Ambulatory Visit: Payer: Self-pay

## 2017-03-09 LAB — LIPID PANEL
Cholesterol: 184 mg/dL (ref 0–200)
HDL: 61 mg/dL (ref >40)
LDL CALC: 106 mg/dL — AB (ref 0–99)
Total CHOL/HDL Ratio: 3 RATIO
Triglycerides: 83 mg/dL (ref <150)
VLDL: 17 mg/dL (ref 0–40)

## 2017-03-09 LAB — GLUCOSE, CAPILLARY
GLUCOSE-CAPILLARY: 278 mg/dL — AB (ref 65–99)
Glucose-Capillary: 257 mg/dL — ABNORMAL HIGH (ref 65–99)

## 2017-03-09 LAB — HEMOGLOBIN A1C
HEMOGLOBIN A1C: 10 % — AB (ref 4.8–5.6)
Mean Plasma Glucose: 240.3 mg/dL

## 2017-03-09 MED ORDER — ACETAMINOPHEN 650 MG RE SUPP: 650.0000 mg | RECTAL | Status: DC | PRN

## 2017-03-09 MED ORDER — ACETAMINOPHEN 325 MG PO TABS: 650.0000 mg | ORAL_TABLET | ORAL | Status: DC | PRN

## 2017-03-09 MED ORDER — ENOXAPARIN SODIUM 40 MG/0.4ML ~~LOC~~ SOLN
40.0000 mg | SUBCUTANEOUS | Status: DC
  Administered 2017-03-09: 02:00:00 40 mg via SUBCUTANEOUS
  Filled 2017-03-09: qty 0.4

## 2017-03-09 MED ORDER — ASPIRIN 325 MG PO TABS
325.0000 mg | ORAL_TABLET | Freq: Every day | ORAL | Status: DC
  Administered 2017-03-09: 325 mg via ORAL
  Filled 2017-03-09: qty 1

## 2017-03-09 MED ORDER — ACETAMINOPHEN 160 MG/5ML PO SOLN
650.0000 mg | ORAL | Status: DC | PRN
Start: 2017-03-09 — End: 2017-03-09
  Filled 2017-03-09: qty 20.3

## 2017-03-09 MED ORDER — STROKE: EARLY STAGES OF RECOVERY BOOK
Freq: Once | Status: AC
  Administered 2017-03-09: 02:00:00

## 2017-03-09 MED ORDER — INSULIN ASPART 100 UNIT/ML ~~LOC~~ SOLN
0.0000 [IU] | Freq: Three times a day (TID) | SUBCUTANEOUS | Status: DC
  Administered 2017-03-09: 5 [IU] via SUBCUTANEOUS
  Filled 2017-03-09: qty 1

## 2017-03-09 MED ORDER — ATORVASTATIN CALCIUM 20 MG PO TABS: 40.0000 mg | ORAL_TABLET | Freq: Every day | ORAL | Status: DC

## 2017-03-09 MED ORDER — INSULIN ASPART 100 UNIT/ML ~~LOC~~ SOLN
0.0000 [IU] | Freq: Every day | SUBCUTANEOUS | Status: DC
  Administered 2017-03-09: 02:00:00 3 [IU] via SUBCUTANEOUS
  Filled 2017-03-09: qty 1

## 2017-03-09 NOTE — Progress Notes (Signed)
SLP Cancellation Note  Patient Details Name: [name] MRN: [id number] DOB: [birth year]   Cancelled treatment:       Reason Eval/Treat Not Completed: SLP screened, no needs identified, will sign off  Speech Therapy Note: received order, reviewed chart notes. Consulted NSG then pt. Pt denied any difficulty swallowing and is currently on a regular diet; tolerates swallowing pills w/ water per NSG. Pt conversed at conversational level w/out deficits noted; pt denied any speech-language deficits.  No further skilled ST services indicated as pt appears at his baseline. Pt agreed. NSG to reconsult if any change in status.   Carolynn Sayers, SLP-Graduate Student Carolynn Sayers 03/09/2017, 9:05 AM   The information in this patient note, response to treatment, and overall treatment plan developed has been reviewed and agreed upon by this clinician.  Orinda Kenner, , [place] [phone] 03/09/17,11:09 AM

## 2017-03-09 NOTE — Progress Notes (Signed)
Discharge instructions and prescription for carotid ultrasound given with verbalized understanding from patient wife.  Patient denies pain at discharge.  IV removed from right AC per policy and procedure.  Patient ambulated out with wife to be taken home in personal vehicle by wife.

## 2017-03-09 NOTE — Discharge Instructions (Signed)
[facility] [place] [facility] ° °DIET:  °Diabetic diet ° °DISCHARGE CONDITION:  °Stable ° °ACTIVITY:  °Activity as tolerated ° °OXYGEN:  °Home Oxygen: No. °  °Oxygen Delivery: room air ° °DISCHARGE LOCATION:  °home  ° ° °ADDITIONAL DISCHARGE INSTRUCTION: ° ° °If you experience worsening of your admission symptoms, develop shortness of breath, life threatening emergency, suicidal or homicidal thoughts you must seek medical attention immediately by calling 911 or calling your MD immediately  if symptoms less severe. ° °You Must read complete instructions/literature along with all the possible adverse reactions/side effects for all the Medicines you take and that have been prescribed to you. Take any new Medicines after you have completely understood and accpet all the possible adverse reactions/side effects.  ° °Please note ° °You were cared for by a hospitalist during your hospital stay. If you have any questions about your discharge medications or the care you received while you were in the hospital after you are discharged, you can call the unit and asked to speak with the hospitalist on call if the hospitalist that took care of you is not available. Once you are discharged, your primary care physician will handle any further medical issues. Please note that NO REFILLS for any discharge medications will be authorized once you are discharged, as it is imperative that you return to your primary care physician (or establish a relationship with a primary care physician if you do not have one) for your aftercare needs so that they can reassess your need for medications and monitor your lab values. ° ° °

## 2017-03-09 NOTE — Discharge Summary (Signed)
[facility] - [place] at [place], 61 y.o., DOB [birth year], MRN [id number]. Admission date: 03/08/2017 Discharge Date 03/09/2017 Primary MD System, Pcp Not In Admitting Physician Lance Coon, MD  Admission Diagnosis  Paresthesia of left arm [R20.2] Confusion and disorientation [F99] Non-intractable vomiting with nausea, unspecified vomiting type [R11.2]  Discharge Diagnosis   Principal Problem: TIA Dizziness Nausea vomiting Diabetes Hyperlipidemia     Hospital Course Deon Duer  is a 61 y.o. male who presents with an episode of left hand tingling, dizziness, subsequent headache, subsequent nausea and vomiting.  Due to these symptoms he came to the ED CT scan of the head was negative.  Had an MRI of the brain which was negative.  Before patient can have his carotid Dopplers and echo he was very anxious to go home.  I have given him a prescription for carotid Dopplers to be done outpatient.  I asked recommended he continue his aspirin his symptoms have now resolved.              Consults  None  Significant Tests:  See full reports for all details     Dg Chest 2 View  Result Date: 03/08/2017 CLINICAL DATA:  Nausea, vomiting and confusion. EXAM: CHEST  2 VIEW COMPARISON:  04/24/2013 FINDINGS: The cardiomediastinal silhouette is unremarkable. There is no evidence of focal airspace disease, pulmonary edema, suspicious pulmonary nodule/mass, pleural effusion, or pneumothorax. No acute bony abnormalities are identified. IMPRESSION: No active cardiopulmonary disease. Electronically Signed   By: Margarette Canada M.D.   On: 03/08/2017 16:38   Ct Head Wo Contrast  Result Date: 03/08/2017 CLINICAL DATA:  Initial evaluation for acute severe headache. EXAM: CT HEAD WITHOUT CONTRAST TECHNIQUE: Contiguous axial images were obtained from the base of the skull through the vertex without intravenous contrast. COMPARISON:  Prior CT from 01/24/2015. FINDINGS:  Brain: Age related cerebral atrophy. Mild chronic small vessel ischemic disease. No acute intracranial hemorrhage. Specifically, no subarachnoid hemorrhage identified. No acute large vessel territory infarct. No mass lesion, midline shift or mass effect. No hydrocephalus. No extra-axial fluid collection. Vascular: No worrisome hyperdense vessel. Scattered vascular calcifications noted within the carotid siphons. Skull: Scalp soft tissues and calvarium within normal limits. Sinuses/Orbits: Globes and orbital soft tissues within normal limits. Visualized paranasal sinuses and mastoid air cells are clear. Other: None. IMPRESSION: 1. No acute intracranial abnormality. 2. Mild atrophy with chronic small vessel ischemic disease. Electronically Signed   By: Jeannine Boga M.D.   On: 03/08/2017 19:17   Mr Brain Wo Contrast  Result Date: 03/08/2017 CLINICAL DATA:  Headache, nausea and vomiting.  TIA. EXAM: MRI HEAD WITHOUT CONTRAST TECHNIQUE: Multiplanar, multiecho pulse sequences of the brain and surrounding structures were obtained without intravenous contrast. COMPARISON:  Head CT 1178 seen FINDINGS: Brain: The midline structures are normal. There is no acute infarct or acute hemorrhage. No mass lesion, hydrocephalus, dural abnormality or extra-axial collection. There is multifocal white matter hyperintensity suggesting chronic ischemic microangiopathy. No age-advanced or lobar predominant atrophy. No chronic microhemorrhage or superficial siderosis. Vascular: Major intracranial arterial and venous sinus flow voids are preserved. Skull and upper cervical spine: The visualized skull base, calvarium, upper cervical spine and extracranial soft tissues are normal. Sinuses/Orbits: No fluid levels or advanced mucosal thickening. No mastoid or middle ear effusion. Normal orbits. IMPRESSION: Mild findings of chronic ischemic microangiopathy without acute intracranial abnormality. Electronically Signed   By: Ulyses Jarred M.D.   On: 03/08/2017 23:35  Today   Subjective:   [name] feels well wants to go home  Objective:   Blood pressure 130/66, pulse 69, temperature 98.5 F (36.9 C), temperature source Oral, resp. rate 16, height 5\' 3"  (1.6 m), weight 141 lb 6.4 oz (64.1 kg), SpO2 95 %.  . No intake or output data in the 24 hours ending 03/09/17 1529  Exam VITAL SIGNS: Blood pressure 130/66, pulse 69, temperature 98.5 F (36.9 C), temperature source Oral, resp. rate 16, height 5\' 3"  (1.6 m), weight 141 lb 6.4 oz (64.1 kg), SpO2 95 %.  GENERAL:  61 y.o.-year-old patient lying in the bed with no acute distress.  EYES: Pupils equal, round, reactive to light and accommodation. No scleral icterus. Extraocular muscles intact.  HEENT: Head atraumatic, normocephalic. Oropharynx and nasopharynx clear.  NECK:  Supple, no jugular venous distention. No thyroid enlargement, no tenderness.  LUNGS: Normal breath sounds bilaterally, no wheezing, rales,rhonchi or crepitation. No use of accessory muscles of respiration.  CARDIOVASCULAR: S1, S2 normal. No murmurs, rubs, or gallops.  ABDOMEN: Soft, nontender, nondistended. Bowel sounds present. No organomegaly or mass.  EXTREMITIES: No pedal edema, cyanosis, or clubbing.  NEUROLOGIC: Cranial nerves II through XII are intact. Muscle strength 5/5 in all extremities. Sensation intact. Gait not checked.  PSYCHIATRIC: The patient is alert and oriented x 3.  SKIN: No obvious rash, lesion, or ulcer.   Data Review     CBC w Diff:  Lab Results  Component Value Date   WBC 16.4 (H) 03/08/2017   HGB 15.3 03/08/2017   HGB 15.1 05/28/2014   HCT 45.1 03/08/2017   HCT 45.1 05/28/2014   PLT 276 03/08/2017   PLT 319 05/28/2014   LYMPHOPCT 4 03/08/2017   LYMPHOPCT 13.2 05/28/2014   MONOPCT 3 03/08/2017   MONOPCT 7.1 05/28/2014   EOSPCT 0 03/08/2017   EOSPCT 0.1 05/28/2014   BASOPCT 0 03/08/2017   BASOPCT 1.1 05/28/2014   CMP:  Lab Results   Component Value Date   NA 136 03/08/2017   NA 134 (L) 05/28/2014   K 4.0 03/08/2017   K 4.3 05/28/2014   CL 95 (L) 03/08/2017   CL 95 (L) 05/28/2014   CO2 26 03/08/2017   CO2 29 05/28/2014   BUN 14 03/08/2017   BUN 14 05/28/2014   CREATININE 0.84 03/08/2017   CREATININE 0.96 05/28/2014   PROT 8.0 03/08/2017   PROT 8.2 05/28/2014   ALBUMIN 4.8 03/08/2017   ALBUMIN 4.0 05/28/2014   BILITOT 1.2 03/08/2017   BILITOT 0.5 05/28/2014   ALKPHOS 131 (H) 03/08/2017   ALKPHOS 176 (H) 05/28/2014   AST 35 03/08/2017   AST 16 05/28/2014   ALT 38 03/08/2017   ALT 27 05/28/2014  .  Micro Results Recent Results (from the past 240 hour(s))  Blood Culture (routine x 2)     Status: None (Preliminary result)   Collection Time: 03/08/17  9:32 PM  Result Value Ref Range Status   Specimen Description BLOOD LT ARM  Final   Special Requests   Final    BOTTLES DRAWN AEROBIC AND ANAEROBIC Blood Culture adequate volume   Culture NO GROWTH < 12 HOURS  Final   Report Status PENDING  Incomplete  Blood Culture (routine x 2)     Status: None (Preliminary result)   Collection Time: 03/08/17  9:32 PM  Result Value Ref Range Status   Specimen Description BLOOD RT [facility]  Final   Special Requests   Final    BOTTLES DRAWN  AEROBIC AND ANAEROBIC Blood Culture adequate volume   Culture NO GROWTH < 12 HOURS  Final   Report Status PENDING  Incomplete     Code Status History    Date Active Date Inactive Code Status Order ID Comments User Context   03/09/2017 01:17 03/09/2017 13:25 Full Code [id number]  Lance Coon, MD Inpatient   01/25/2015 06:45 01/25/2015 20:35 Full Code [id number]  Harrie Foreman, MD Inpatient          Follow-up Information    Oneita Kras, MD Follow up in 1 week(s).   Specialty:  Endocrinology Why:  hospital f/u Contact information: [address] [place] [address] [phone]           Discharge Medications   Allergies as of 03/09/2017    No Known Allergies     Medication List    TAKE these medications   aspirin 325 MG tablet Take 325 mg by mouth daily.   atorvastatin 40 MG tablet Commonly known as:  LIPITOR Take 1 tablet (40 mg total) by mouth daily.   FISH OIL PO Take 1 capsule by mouth daily.   insulin aspart 100 UNIT/ML injection Commonly known as:  novoLOG Inject into the skin 3 (three) times daily before meals. Sliding scale 1 unit per 10 carbs   insulin detemir 100 UNIT/ML injection Commonly known as:  LEVEMIR Inject 0.2 mLs (20 Units total) into the skin every morning.   insulin detemir 100 UNIT/ML injection Commonly known as:  LEVEMIR Inject 0.18 mLs (18 Units total) into the skin at bedtime.   multivitamin with minerals Tabs tablet Take 1 tablet by mouth daily.          Total Time in preparing paper work, data evaluation and todays exam - 35 minutes  Dustin Flock M.D on 03/09/2017 at 3:29 PM  [facility]   Office  [phone]

## 2017-03-09 NOTE — Evaluation (Signed)
Physical Therapy Evaluation Patient Details Name: LINSEY HIROTA MRN: [id number] DOB: [birth year] Today's Date: 03/09/2017   History of Present Illness  61 y/o who had sudden onset L hand numbness (no weakness, etc), he thought it may be hypoglycemia but this was not the case.  He then had some nausea and vomiting that was the impetus to come to the hospital.  Pt feeling back to baseline at time of PT exam.   Clinical Impression  Pt did well with PT exam and does not have any residual symptoms.  He displayed good mobility, strength, coordination, balance, etc and was safe with community appropriate distances and stair negotiation.  Pt safe to go home with no further PT needs, will sign off.     Follow Up Recommendations No PT follow up    Equipment Recommendations       Recommendations for Other Services       Precautions / Restrictions Precautions Precautions: None Restrictions Weight Bearing Restrictions: No      Mobility  Bed Mobility Overal bed mobility: Independent             General bed mobility comments: Pt able to get to EOB and had no issues with transition  Transfers Overall transfer level: Independent Equipment used: Rolling walker (2 wheeled)             General transfer comment: Pt is able to rise w/o safety issues.  Ambulation/Gait Ambulation/Gait assistance: Independent Ambulation Distance (Feet): 300 Feet Assistive device: None       General Gait Details: Pt walks with good speed, confidence and safety and did not have excessive fatigue.    Stairs            Wheelchair Mobility    Modified Rankin (Stroke Patients Only)       Balance Overall balance assessment: Independent                                           Pertinent Vitals/Pain Pain Assessment: No/denies pain    Home Living Family/patient expects to be discharged to:: Private residence Living Arrangements: Spouse/significant other     Home  Access: Stairs to enter   Technical brewer of Steps: 3          Prior Function Level of Independence: Independent         Comments: Pt working as Pharmacist, hospital, independent, able to do all he needs.     Hand Dominance   Dominant Hand: Right    Extremity/Trunk Assessment   Upper Extremity Assessment Upper Extremity Assessment: Overall WFL for tasks assessed    Lower Extremity Assessment Lower Extremity Assessment: Overall WFL for tasks assessed       Communication   Communication: No difficulties  Cognition Arousal/Alertness: Awake/alert Behavior During Therapy: WFL for tasks assessed/performed Overall Cognitive Status: Within Functional Limits for tasks assessed                                        General Comments      Exercises     Assessment/Plan    PT Assessment Patent does not need any further PT services  PT Problem List         PT Treatment Interventions      PT Goals (Current goals can be  found in the Care Plan section)  Acute Rehab PT Goals Patient Stated Goal: go home PT Goal Formulation: All assessment and education complete, DC therapy    Frequency     Barriers to discharge        Co-evaluation               AM-PAC PT "6 Clicks" Daily Activity  Outcome Measure Difficulty turning over in bed (including adjusting bedclothes, sheets and blankets)?: None Difficulty moving from lying on back to sitting on the side of the bed? : None Difficulty sitting down on and standing up from a chair with arms (e.g., wheelchair, bedside commode, etc,.)?: None Help needed moving to and from a bed to chair (including a wheelchair)?: None Help needed walking in hospital room?: None Help needed climbing 3-5 steps with a railing? : None 6 Click Score: 24    End of Session Equipment Utilized During Treatment: Gait belt Activity Tolerance: Patient tolerated treatment well Patient left: in bed;with call bell/phone within  reach   PT Visit Diagnosis: Difficulty in walking, not elsewhere classified (R26.2);Muscle weakness (generalized) (M62.81)    Time: 6269-4854 PT Time Calculation (min) (ACUTE ONLY): 16 min   Charges:   PT Evaluation $PT Eval Low Complexity: 1 Low     PT G Codes:   PT G-Codes **NOT FOR INPATIENT CLASS** Functional Assessment Tool Used: AM-PAC 6 Clicks Basic Mobility Functional Limitation: Mobility: Walking and moving around Mobility: Walking and Moving Around Current Status (O2703): 0 percent impaired, limited or restricted Mobility: Walking and Moving Around Goal Status (J0093): 0 percent impaired, limited or restricted Mobility: Walking and Moving Around Discharge Status (G1829): 0 percent impaired, limited or restricted    Kreg Shropshire, DPT 03/09/2017, 10:45 AM

## 2017-03-09 NOTE — Progress Notes (Signed)
Patient refused to have ultrasound of carotids completed and requests to be discharged.  Dr. Posey Pronto called and notified.

## 2017-03-09 NOTE — Progress Notes (Signed)
[name] at [place] was admitted to the [facility] on 03/08/2017 and Discharged  03/09/2017 and should be excused from work/school   For 1  days starting 03/08/2017 , may return to work/school without any restrictions today on 03/09/2017  Call Dustin Flock MD with questions.  Dustin Flock M.D on 03/09/2017,at 11:58 AM  [place] at [facility]  [phone]

## 2017-03-09 NOTE — Progress Notes (Signed)
Medications administered by student RN [id number] with supervision of Clinical Instructor Lynann Bologna MSN, RN-BC or patient's assigned RN.

## 2017-03-09 NOTE — Progress Notes (Signed)
OT Cancellation Note  Patient Details Name: [name] MRN: [id number] DOB: 05/20/1955   Cancelled Treatment:      Screened patient this date and he appears to be back to baseline, independent with transfers, self care tasks and no weakness noted.  Denies any further symptoms and does not require further OT evaluation at this time.  Please reconsult if needs arise.   Chenee Munns T Lajoya Dombek, OTR/L, CLT  Mclean Moya 03/09/2017, 8:47 AM

## 2017-03-10 LAB — HIV ANTIBODY (ROUTINE TESTING W REFLEX): HIV Screen 4th Generation wRfx: NONREACTIVE

## 2017-03-13 LAB — BLOOD GAS, VENOUS
Acid-Base Excess: 4.9 mmol/L — ABNORMAL HIGH (ref 0.0–2.0)
BICARBONATE: 31 mmol/L — AB (ref 20.0–28.0)
PCO2 VEN: 50 mmHg (ref 44.0–60.0)
Patient temperature: 37
pH, Ven: 7.4 (ref 7.250–7.430)

## 2017-03-14 LAB — CULTURE, BLOOD (ROUTINE X 2)
Culture: NO GROWTH
Culture: NO GROWTH
Special Requests: ADEQUATE
Special Requests: ADEQUATE

## 2018-09-27 DIAGNOSIS — E1069 Type 1 diabetes mellitus with other specified complication: Secondary | ICD-10-CM | POA: Insufficient documentation

## 2018-09-27 DIAGNOSIS — Z8673 Personal history of transient ischemic attack (TIA), and cerebral infarction without residual deficits: Secondary | ICD-10-CM | POA: Insufficient documentation

## 2018-09-27 DIAGNOSIS — E103413 Type 1 diabetes mellitus with severe nonproliferative diabetic retinopathy with macular edema, bilateral: Secondary | ICD-10-CM | POA: Insufficient documentation

## 2018-10-16 IMAGING — MR MR HEAD W/O CM
10 series · 46 of 48 positions shown · non-contrast
Comparison: Head CT 0090 seen

CLINICAL DATA: Headache, nausea and vomiting.  TIA.

EXAM:
MRI HEAD WITHOUT CONTRAST
TECHNIQUE: Multiplanar, multiecho pulse sequences of the brain and surrounding
structures were obtained without intravenous contrast.

[Series 2: GRE · sagittal · 5.0mm · 0.45mm/px · 2 of 22 slices shown (1 of 2)]
[im 1/22]
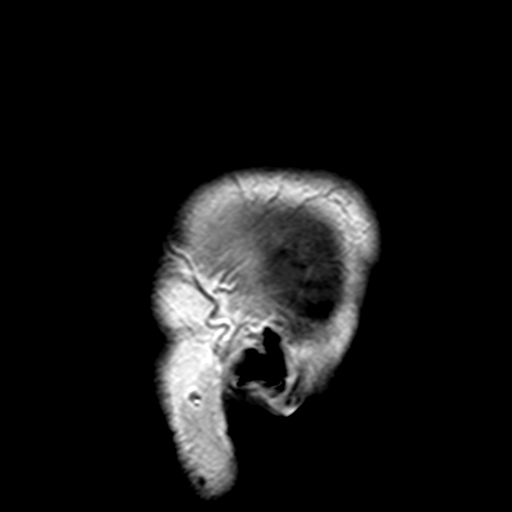
[im 22/22]
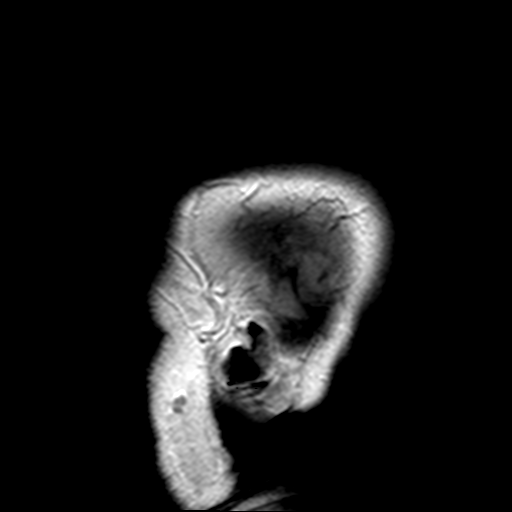

[Series 4: DWI · axial · 3.0mm · 1.80mm/px · z∈[-57,+90]mm · 7 of 51 slices shown (1 of 2)]
[im 1/51]
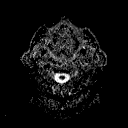
[im 9/51]
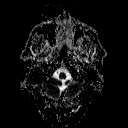
[im 17/51]
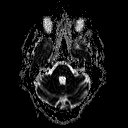
[im 26/51]
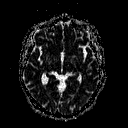
[im 34/51]
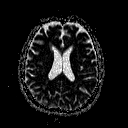
[im 42/51]
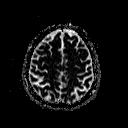
[im 51/51]
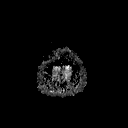

[Series 6: DWI · coronal · 3.0mm · 1.80mm/px · 6 of 44 slices shown (2 of 2)]
[im 1/44]
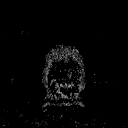
[im 9/44]
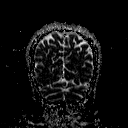
[im 18/44]
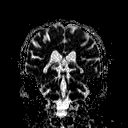
[im 26/44]
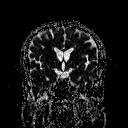
[im 35/44]
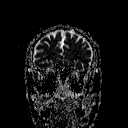
[im 44/44]
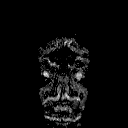

[Series 7: T2 · axial · 5.0mm · 0.45mm/px · z∈[-59,+90]mm · 3 of 25 slices shown (1 of 3)]
[im 1/25]
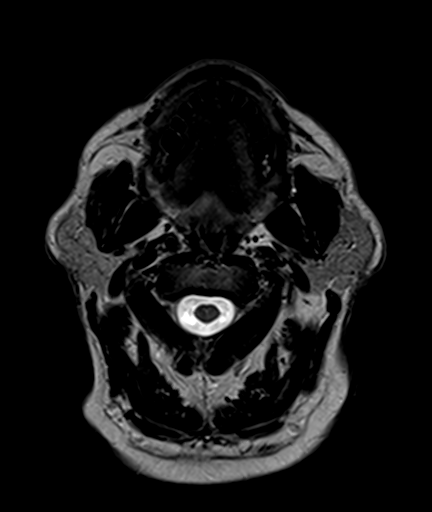
[im 13/25]
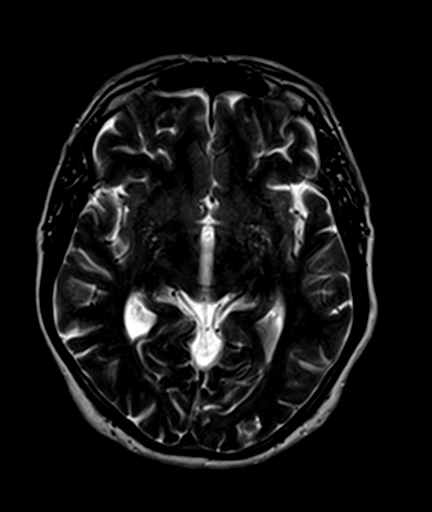
[im 25/25]
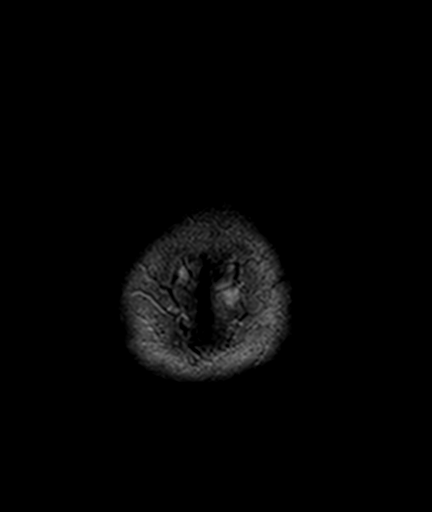

[Series 8: FLAIR · axial · 3.0mm · 0.45mm/px · z∈[-59,+90]mm · 7 of 53 slices shown]
[im 1/53]
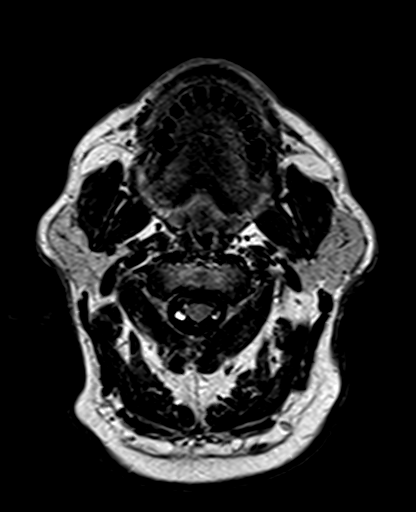
[im 9/53]
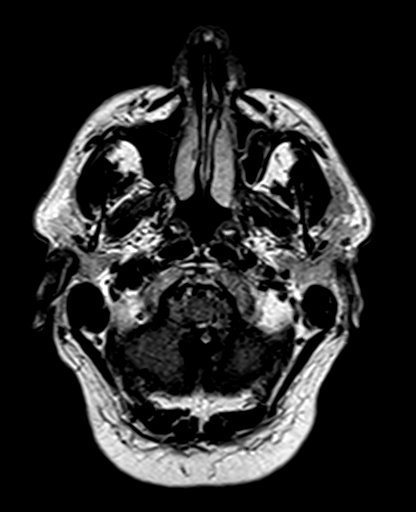
[im 18/53]
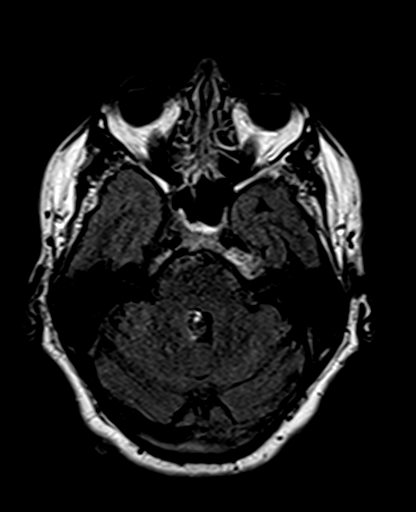
[im 27/53]
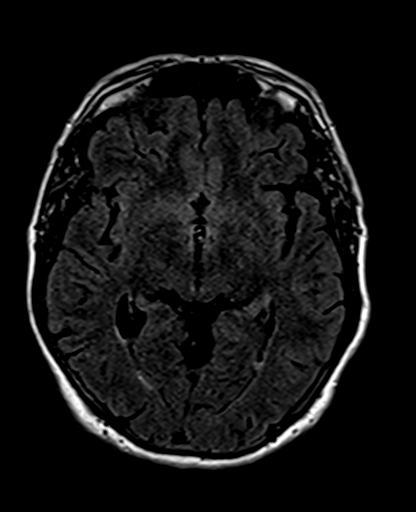
[im 35/53]
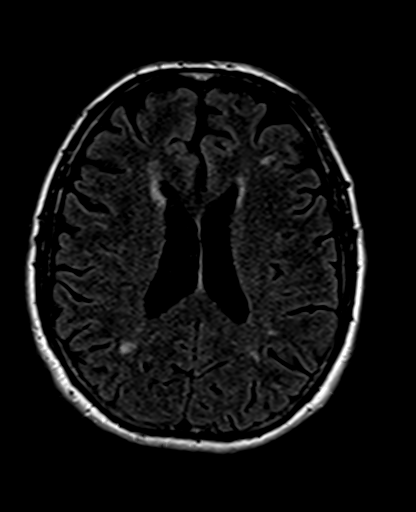
[im 44/53]
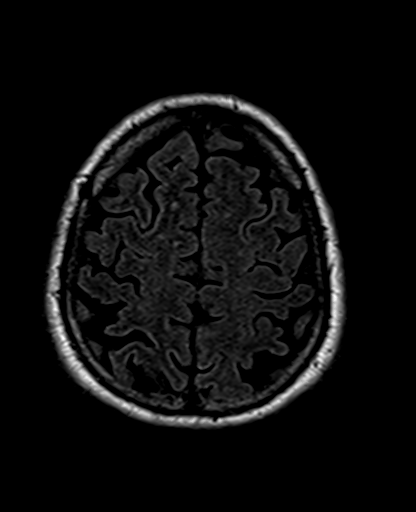
[im 53/53]
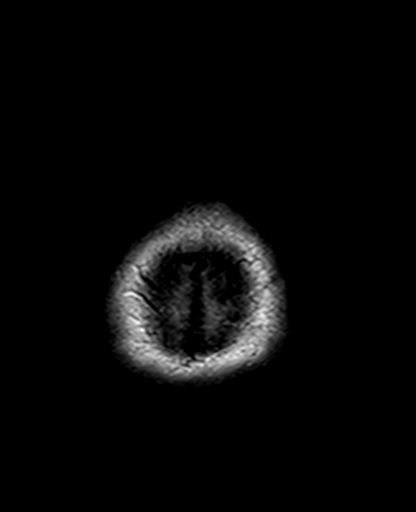

[Series 9: GRE · axial · 5.0mm · 0.45mm/px · z∈[-59,+91]mm · 3 of 25 slices shown (2 of 2)]
[im 1/25]
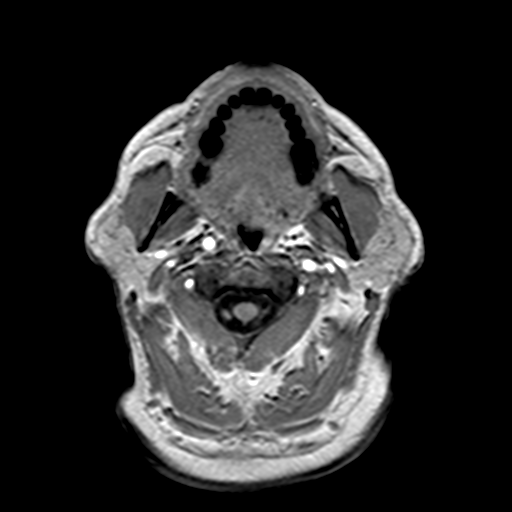
[im 13/25]
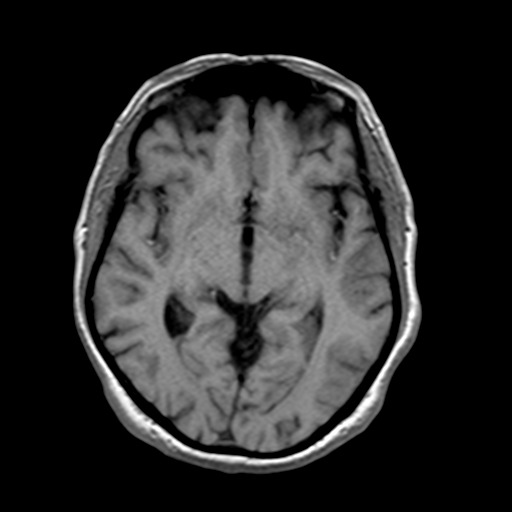
[im 25/25]
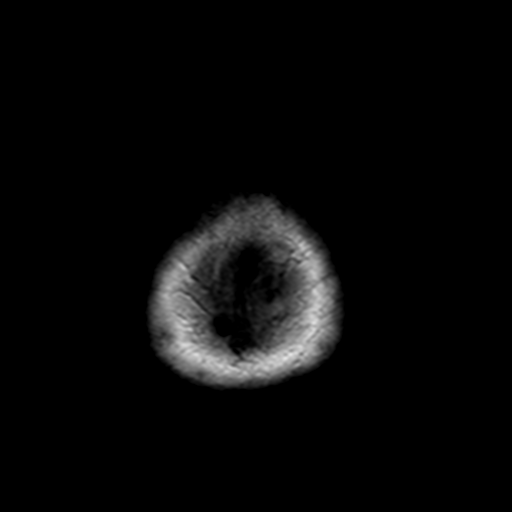

[Series 10: T2 · axial · 5.0mm · 1.20mm/px · z∈[-59,+91]mm · 3 of 25 slices shown (2 of 3)]
[im 1/25]
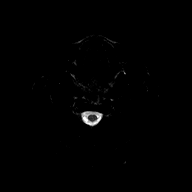
[im 13/25]
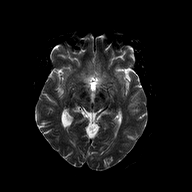
[im 25/25]
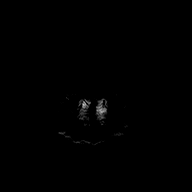

[Series 11: T2 · coronal · 5.0mm · 0.45mm/px · 4 of 27 slices shown (3 of 3)]
[im 1/27]
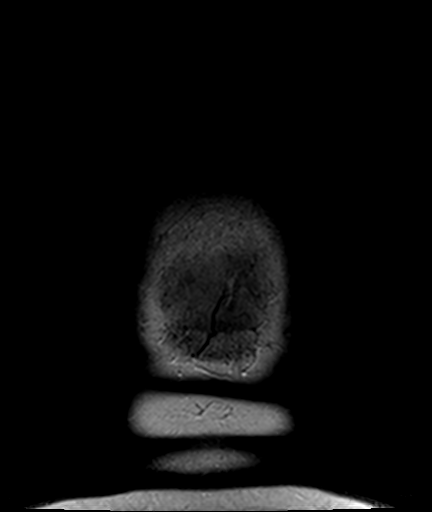
[im 9/27]
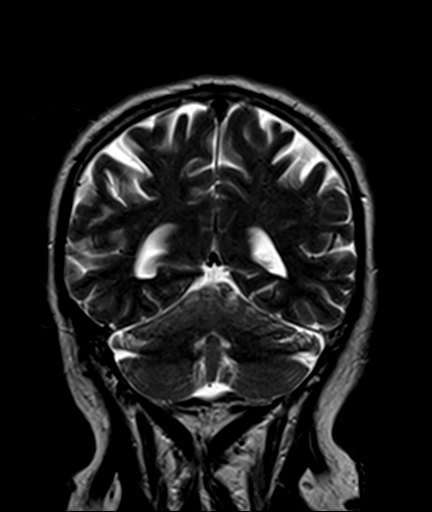
[im 18/27]
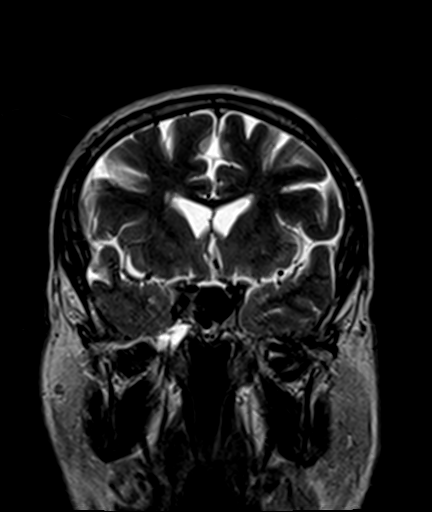
[im 27/27]
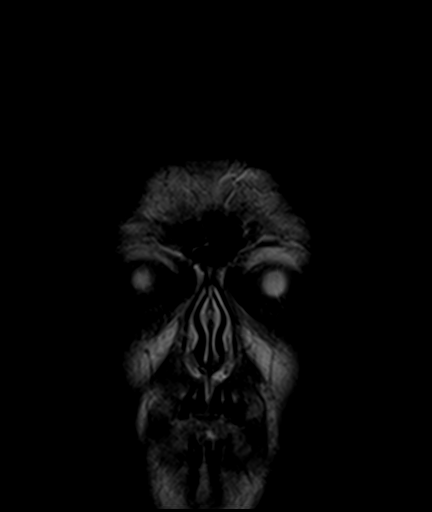

[Series 100: cor (id) · coronal · 3.0mm · 1.80mm/px · 6 of 44 slices shown]
[im 1/44]
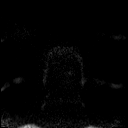
[im 9/44]
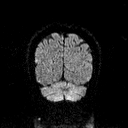
[im 18/44]
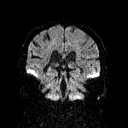
[im 26/44]
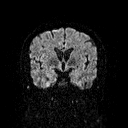
[im 35/44]
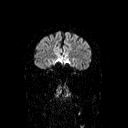
[im 44/44]
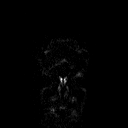

[Series 101: ax (id) · axial · 3.0mm · 1.80mm/px · z∈[-57,+41]mm · 5 of 52 slices shown]
[im 1/52]
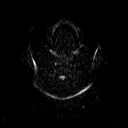
[im 9/52]
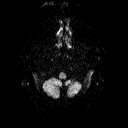
[im 18/52]
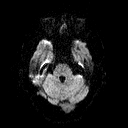
[im 26/52]
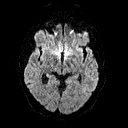
[im 35/52]
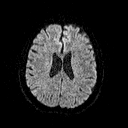

[46 of 48 positions shown; findings below may reference images not displayed]

FINDINGS: Brain: The midline structures are normal. There is no acute infarct
or acute hemorrhage. No mass lesion, hydrocephalus, dural
abnormality or extra-axial collection. There is multifocal white
matter hyperintensity suggesting chronic ischemic microangiopathy.
No age-advanced or lobar predominant atrophy. No chronic
microhemorrhage or superficial siderosis.

Vascular: Major intracranial arterial and venous sinus flow voids
are preserved.

Skull and upper cervical spine: The visualized skull base,
calvarium, upper cervical spine and extracranial soft tissues are
normal.

Sinuses/Orbits: No fluid levels or advanced mucosal thickening. No
mastoid or middle ear effusion. Normal orbits.
IMPRESSION: Mild findings of chronic ischemic microangiopathy without acute
intracranial abnormality.

## 2018-11-02 ENCOUNTER — Other Ambulatory Visit: Payer: Self-pay

## 2018-11-02 ENCOUNTER — Other Ambulatory Visit
Admission: RE | Admit: 2018-11-02 | Discharge: 2018-11-02 | Disposition: A | Discharge status: Home / Self Care | Payer: Managed Care, Other (non HMO) | Source: Ambulatory Visit | Attending: Gastroenterology | Admitting: Gastroenterology

## 2018-11-02 ENCOUNTER — Other Ambulatory Visit: Admission: RE | Admit: 2018-11-02 | Payer: Managed Care, Other (non HMO) | Source: Ambulatory Visit

## 2018-11-02 DIAGNOSIS — Z1159 Encounter for screening for other viral diseases: Secondary | ICD-10-CM | POA: Diagnosis not present

## 2018-11-02 DIAGNOSIS — Z01812 Encounter for preprocedural laboratory examination: Secondary | ICD-10-CM | POA: Diagnosis present

## 2018-11-02 LAB — SARS CORONAVIRUS 2 (TAT 6-24 HRS): SARS Coronavirus 2: NEGATIVE

## 2018-11-05 ENCOUNTER — Encounter: Payer: Self-pay | Admitting: Anesthesiology

## 2018-11-06 ENCOUNTER — Ambulatory Visit: Payer: Managed Care, Other (non HMO) | Admitting: Anesthesiology

## 2018-11-06 ENCOUNTER — Encounter
Admission: RE | Disposition: A | Discharge status: Home / Self Care | Payer: Self-pay | Source: Home / Self Care | Attending: Gastroenterology

## 2018-11-06 ENCOUNTER — Other Ambulatory Visit: Payer: Self-pay

## 2018-11-06 ENCOUNTER — Ambulatory Visit
Admission: RE | Admit: 2018-11-06 | Discharge: 2018-11-06 | Disposition: A | Discharge status: Home / Self Care | Payer: Managed Care, Other (non HMO) | Attending: Gastroenterology | Admitting: Gastroenterology

## 2018-11-06 ENCOUNTER — Encounter: Payer: Self-pay | Admitting: *Deleted

## 2018-11-06 DIAGNOSIS — K5730 Diverticulosis of large intestine without perforation or abscess without bleeding: Secondary | ICD-10-CM | POA: Diagnosis not present

## 2018-11-06 DIAGNOSIS — Z7982 Long term (current) use of aspirin: Secondary | ICD-10-CM | POA: Insufficient documentation

## 2018-11-06 DIAGNOSIS — Z8673 Personal history of transient ischemic attack (TIA), and cerebral infarction without residual deficits: Secondary | ICD-10-CM | POA: Insufficient documentation

## 2018-11-06 DIAGNOSIS — Z87891 Personal history of nicotine dependence: Secondary | ICD-10-CM | POA: Diagnosis not present

## 2018-11-06 DIAGNOSIS — Z79899 Other long term (current) drug therapy: Secondary | ICD-10-CM | POA: Diagnosis not present

## 2018-11-06 DIAGNOSIS — Z794 Long term (current) use of insulin: Secondary | ICD-10-CM | POA: Insufficient documentation

## 2018-11-06 DIAGNOSIS — E119 Type 2 diabetes mellitus without complications: Secondary | ICD-10-CM | POA: Insufficient documentation

## 2018-11-06 DIAGNOSIS — D122 Benign neoplasm of ascending colon: Secondary | ICD-10-CM | POA: Insufficient documentation

## 2018-11-06 DIAGNOSIS — Z1211 Encounter for screening for malignant neoplasm of colon: Secondary | ICD-10-CM | POA: Insufficient documentation

## 2018-11-06 DIAGNOSIS — D126 Benign neoplasm of colon, unspecified: Secondary | ICD-10-CM | POA: Insufficient documentation

## 2018-11-06 HISTORY — PX: COLONOSCOPY WITH PROPOFOL: SHX5780

## 2018-11-06 LAB — GLUCOSE, CAPILLARY: Glucose-Capillary: 226 mg/dL — ABNORMAL HIGH (ref 70–99)

## 2018-11-06 SURGERY — COLONOSCOPY WITH PROPOFOL
Anesthesia: General

## 2018-11-06 MED ORDER — PROPOFOL 500 MG/50ML IV EMUL
INTRAVENOUS | Status: DC | PRN
  Administered 2018-11-06: 120 ug/kg/min via INTRAVENOUS

## 2018-11-06 MED ORDER — PROPOFOL 10 MG/ML IV BOLUS
INTRAVENOUS | Status: DC | PRN
  Administered 2018-11-06: 50 mg via INTRAVENOUS
  Administered 2018-11-06: 20 mg via INTRAVENOUS
  Administered 2018-11-06: 40 mg via INTRAVENOUS
  Administered 2018-11-06: 30 mg via INTRAVENOUS

## 2018-11-06 MED ORDER — PROPOFOL 10 MG/ML IV BOLUS
INTRAVENOUS | Status: AC
  Filled 2018-11-06: qty 80

## 2018-11-06 MED ORDER — SODIUM CHLORIDE 0.9 % IV SOLN
INTRAVENOUS | Status: DC
  Administered 2018-11-06 (×2): via INTRAVENOUS

## 2018-11-06 NOTE — Anesthesia Postprocedure Evaluation (Signed)
Anesthesia Post Note  Patient: [name]  Procedure(s) Performed: COLONOSCOPY WITH PROPOFOL (N/A )  Patient location during evaluation: Endoscopy Anesthesia Type: General Level of consciousness: awake and alert Pain management: pain level controlled Vital Signs Assessment: post-procedure vital signs reviewed and stable Respiratory status: spontaneous breathing, nonlabored ventilation, respiratory function stable and patient connected to nasal cannula oxygen Cardiovascular status: blood pressure returned to baseline and stable Postop Assessment: no apparent nausea or vomiting Anesthetic complications: no     Last Vitals:  Vitals:   11/06/18 0846 11/06/18 0856  BP: 115/76 130/76  Pulse: 68 (!) 56  Resp: 12 13  Temp:    SpO2: 96% 95%    Last Pain:  Vitals:   11/06/18 0856  TempSrc:   PainSc: 0-No pain                 Tatem Holsonback S

## 2018-11-06 NOTE — Anesthesia Post-op Follow-up Note (Signed)
Anesthesia QCDR form completed.        

## 2018-11-06 NOTE — Op Note (Signed)
[facility] Patient Name: [name] Procedure Date: 11/06/2018 7:32 AM MRN: [id number] Account #: [id number] Date of Birth: 1956-04-18 Admit Type: Outpatient Age: 63 Room: [facility] ENDO ROOM 2 Gender: Male Note Status: Finalized Procedure:            Colonoscopy Indications:          Screening for colorectal malignant neoplasm Providers:            Lollie Sails, MD Referring MD:         No Local Md, MD (Referring MD) Medicines:            Monitored Anesthesia Care Complications:        No immediate complications. Procedure:            Pre-Anesthesia Assessment:                       - ASA Grade Assessment: II - A patient with mild                        systemic disease.                       After obtaining informed consent, the colonoscope was                        passed under direct vision. Throughout the procedure,                        the patient's blood pressure, pulse, and oxygen                        saturations were monitored continuously. The                        Colonoscope was introduced through the anus and                        advanced to the the cecum, identified by appendiceal                        orifice and ileocecal valve. The colonoscopy was                        performed with moderate difficulty due to poor bowel                        prep and significant looping. Successful completion of                        the procedure was aided by applying abdominal pressure                        and lavage. The patient tolerated the procedure well.                        The quality of the bowel preparation was fair. Findings:      Two multi-lobulated and sessile polyps were found in the distal       ascending colon. The polyps were 6 to 17 mm in size. These polyps were       removed  with a saline injection-lift technique using a hot snare, cold       snare and cold forcep. Resection and retrieval were complete.     A few small-mouthed diverticula were found in the transverse colon and       ascending colon.      The retroflexed view of the distal rectum and anal verge was normal and       showed no anal or rectal abnormalities.      The exam was otherwise normal throughout the examined colon. Impression:           - Preparation of the colon was fair.                       - Two 6 to 17 mm polyps in the distal ascending colon,                        removed using injection-lift and a hot snare. Resected                        and retrieved.                       - Diverticulosis in the transverse colon and in the                        ascending colon.                       - The distal rectum and anal verge are normal on                        retroflexion view. Recommendation:       - Discharge patient to home.                       - Await pathology results.                       - Telephone GI clinic for pathology results in 1 week. Procedure Code(s):    --- Professional ---                       [phone], Colonoscopy, flexible; with removal of tumor(s),                        polyp(s), or other lesion(s) by snare technique                       45381, Colonoscopy, flexible; with directed submucosal                        injection(s), any substance Diagnosis Code(s):    --- Professional ---                       Z12.11, Encounter for screening for malignant neoplasm                        of colon                       K63.5, Polyp of colon  K57.30, Diverticulosis of large intestine without                        perforation or abscess without bleeding CPT copyright 2019 American [employer] Association. All rights reserved. The codes documented in this report are preliminary and upon coder review may  be revised to meet current compliance requirements. Lollie Sails, MD 11/06/2018 8:27:07 AM This report has been signed electronically. Number of Addenda: 0 Note Initiated  On: 11/06/2018 7:32 AM Scope Withdrawal Time: 0 hours 32 minutes 18 seconds  Total Procedure Duration: 0 hours 45 minutes 25 seconds       [facility]

## 2018-11-06 NOTE — Anesthesia Preprocedure Evaluation (Signed)
Anesthesia Evaluation  Patient identified by MRN, date of birth, ID band Patient awake    Reviewed: Allergy & Precautions, NPO status , Patient's Chart, lab work & pertinent test results, reviewed documented beta blocker date and time   Airway Mallampati: II  TM Distance: >3 FB     Dental  (+) Chipped   Pulmonary former smoker,           Cardiovascular      Neuro/Psych TIA   GI/Hepatic   Endo/Other  diabetes, Type 2  Renal/GU      Musculoskeletal   Abdominal   Peds  Hematology   Anesthesia Other Findings   Reproductive/Obstetrics                             Anesthesia Physical Anesthesia Plan  ASA: II  Anesthesia Plan: General   Post-op Pain Management:    Induction: Intravenous  PONV Risk Score and Plan:   Airway Management Planned:   Additional Equipment:   Intra-op Plan:   Post-operative Plan:   Informed Consent: I have reviewed the patients History and Physical, chart, labs and discussed the procedure including the risks, benefits and alternatives for the proposed anesthesia with the patient or authorized representative who has indicated his/her understanding and acceptance.       Plan Discussed with: CRNA  Anesthesia Plan Comments:         Anesthesia Quick Evaluation

## 2018-11-06 NOTE — Transfer of Care (Signed)
Immediate Anesthesia Transfer of Care Note  Patient: [name]  Procedure(s) Performed: COLONOSCOPY WITH PROPOFOL (N/A )  Patient Location: PACU and Endoscopy Unit  Anesthesia Type:General  Level of Consciousness: sedated and drowsy  Airway & Oxygen Therapy: Patient Spontanous Breathing and Patient connected to nasal cannula oxygen  Post-op Assessment: Report given to RN and Post -op Vital signs reviewed and stable  Post vital signs: Reviewed and stable  Last Vitals:  Vitals Value Taken Time  BP    Temp    Pulse    Resp    SpO2      Last Pain:  Vitals:   11/06/18 0655  TempSrc: Tympanic  PainSc: 0-No pain         Complications: No apparent anesthesia complications

## 2018-11-06 NOTE — H&P (Signed)
Outpatient short stay form Pre-procedure 11/06/2018 7:30 AM Lollie Sails MD  Primary Physician: Genene Churn, md Reason for visit: Colonoscopy  History of present illness: Patient is a 63 year old male presented today for screening colonoscopy.  His last colonoscopy was in 1275 I am uncertain of the result could not obtain those.  He tolerated his prep well.  Take 81 mg aspirin that is been held.  He takes no other aspirin product or blood thinning agent.  Any rectal bleeding bowel habit changes diarrhea or abdominal pain.    Current Facility-Administered Medications:  .  0.9 %  sodium chloride infusion, , Intravenous, Continuous, Lollie Sails, MD, Last Rate: 20 mL/hr at 11/06/18 0708  Medications Prior to Admission  Medication Sig Dispense Refill Last Dose  . aspirin 325 MG tablet Take 81 mg by mouth daily.    10/30/2018 at Unknown time  . insulin aspart (NOVOLOG) 100 UNIT/ML injection Inject into the skin 3 (three) times daily before meals. Sliding scale 1 unit per 10 carbs   11/05/2018 at 1800  . insulin detemir (LEVEMIR) 100 UNIT/ML injection Inject 0.2 mLs (20 Units total) into the skin every morning. 10 mL 11 11/06/2018 at 0545  . Multiple Vitamin (MULTIVITAMIN WITH MINERALS) TABS tablet Take 1 tablet by mouth daily.   Past Week at Unknown time  . Omega-3 Fatty Acids (FISH OIL PO) Take 1 capsule by mouth daily.   Past Week at Unknown time  . atorvastatin (LIPITOR) 40 MG tablet Take 1 tablet (40 mg total) by mouth daily. (Patient not taking: Reported on 11/06/2018) 30 tablet 0 Not Taking at Unknown time  . insulin detemir (LEVEMIR) 100 UNIT/ML injection Inject 0.18 mLs (18 Units total) into the skin at bedtime. 10 mL 11      No Known Allergies   Past Medical History:  Diagnosis Date  . Diabetes mellitus without complication ([place])   . TIA (transient ischemic attack)     Review of systems:      Physical Exam    Heart and lungs: Rhythm without rub or gallop, lungs are  bilaterally clear.    HEENT: Normocephalic atraumatic eyes are anicteric    Other:    Pertinant exam for procedure: Soft nontender nondistended bowel sounds positive normoactive.    Planned proceedures: Colonoscopy and indicated procedures. I have discussed the risks benefits and complications of procedures to include not limited to bleeding, infection, perforation and the risk of sedation and the patient wishes to proceed.    Lollie Sails, MD Gastroenterology 11/06/2018  7:30 AM

## 2018-11-07 ENCOUNTER — Encounter: Payer: Self-pay | Admitting: Gastroenterology

## 2018-12-04 LAB — SURGICAL PATHOLOGY

## 2019-02-08 ENCOUNTER — Encounter: Admission: RE | Admit: 2019-02-08 | Payer: Managed Care, Other (non HMO) | Source: Ambulatory Visit

## 2019-03-11 ENCOUNTER — Other Ambulatory Visit: Payer: Self-pay

## 2019-03-11 DIAGNOSIS — Z20822 Contact with and (suspected) exposure to covid-19: Secondary | ICD-10-CM

## 2019-03-12 LAB — NOVEL CORONAVIRUS, NAA: SARS-CoV-2, NAA: DETECTED — AB

## 2020-04-01 DIAGNOSIS — R748 Abnormal levels of other serum enzymes: Secondary | ICD-10-CM | POA: Insufficient documentation

## 2020-10-23 DIAGNOSIS — E113219 Type 2 diabetes mellitus with mild nonproliferative diabetic retinopathy with macular edema, unspecified eye: Secondary | ICD-10-CM | POA: Insufficient documentation

## 2021-07-13 DIAGNOSIS — I152 Hypertension secondary to endocrine disorders: Secondary | ICD-10-CM | POA: Insufficient documentation

## 2021-11-29 ENCOUNTER — Ambulatory Visit (INDEPENDENT_AMBULATORY_CARE_PROVIDER_SITE_OTHER): Payer: Medicare Other | Admitting: Internal Medicine

## 2021-11-29 VITALS — BP 159/92 | HR 73 | Resp 12 | Ht 63.5 in | Wt 161.4 lb

## 2021-11-29 DIAGNOSIS — E1159 Type 2 diabetes mellitus with other circulatory complications: Secondary | ICD-10-CM

## 2021-11-29 DIAGNOSIS — G4733 Obstructive sleep apnea (adult) (pediatric): Secondary | ICD-10-CM

## 2021-11-29 DIAGNOSIS — I152 Hypertension secondary to endocrine disorders: Secondary | ICD-10-CM | POA: Diagnosis not present

## 2021-11-29 NOTE — Progress Notes (Signed)
Sleep Medicine   Office Visit  Patient Name: [name] DOB: 08-19-1955 MRN [id number]    Chief Complaint: sleep evaluation  Brief History:  [name] presents for an initial sleep evaluation with a 6 month history of gasping for breath in his sleep and loud snoring.  Patient reports his sleep quality is fair due loud snoring. This is noted most nights. The patient's bed partner reports  loud snoring, breath pausing at night. The patient relates the following symptoms: loud snoring that wakes him and lack of energy, are also present. The patient goes to sleep at 12pm -1am and wakes up at 7:30-8:30a. Patient wakes once a night to use the restroom.  Sleep quality is the same when outside home environment.  Patient has noted mild restlessness of his legs at night.  The patient  relates no unusual behavior during the night.  The patient denies a history of psychiatric problems. The Epworth Sleepiness Score is 5 out of 24 .  The patient relates  Cardiovascular risk factors include: hypertension, TIA.  The patient reports that back in November and December 2022 he remembers waking in a panic as if he was being choked.    ROS  General: (-) fever, (-) chills, (-) night sweat Nose and Sinuses: (-) nasal stuffiness or itchiness, (-) postnasal drip, (-) nosebleeds, (-) sinus trouble. Mouth and Throat: (-) sore throat, (-) hoarseness. Neck: (-) swollen glands, (-) enlarged thyroid, (-) neck pain. Respiratory: - cough, - shortness of breath, - wheezing. Neurologic: - numbness, - tingling. Psychiatric: - anxiety, - depression Sleep behavior: -sleep paralysis -hypnogogic hallucinations -dream enactment      -vivid dreams -cataplexy -night terrors -sleep walking   Current Medication: Outpatient Encounter Medications as of 11/29/2021  Medication Sig   aspirin 325 MG tablet Take 81 mg by mouth daily.    atorvastatin (LIPITOR) 40 MG tablet Take 1 tablet (40 mg total) by mouth daily. (Patient not  taking: Reported on 11/06/2018)   insulin aspart (NOVOLOG) 100 UNIT/ML injection Inject into the skin 3 (three) times daily before meals. Sliding scale 1 unit per 10 carbs   insulin detemir (LEVEMIR) 100 UNIT/ML injection Inject 0.2 mLs (20 Units total) into the skin every morning.   insulin detemir (LEVEMIR) 100 UNIT/ML injection Inject 0.18 mLs (18 Units total) into the skin at bedtime.   Multiple Vitamin (MULTIVITAMIN WITH MINERALS) TABS tablet Take 1 tablet by mouth daily.   Omega-3 Fatty Acids (FISH OIL PO) Take 1 capsule by mouth daily.   No facility-administered encounter medications on file as of 11/29/2021.    Surgical History: Past Surgical History:  Procedure Laterality Date   COLONOSCOPY     COLONOSCOPY WITH PROPOFOL N/A 11/06/2018   Procedure: COLONOSCOPY WITH PROPOFOL;  Surgeon: Lollie Sails, MD;  Location: [facility] ENDOSCOPY;  Service: Endoscopy;  Laterality: N/A;    Medical History: Past Medical History:  Diagnosis Date   Diabetes mellitus without complication (HCC)    TIA (transient ischemic attack)     Family History: Non contributory to the present illness  Social History: Social History   Socioeconomic History   Marital status: Married    Spouse name: Not on file   Number of children: Not on file   Years of education: Not on file   Highest education level: Not on file  Occupational History   Occupation: teacher  Tobacco Use   Smoking status: Former    Types: Cigarettes    Quit date: 1990    Years since  quitting: 33.6   Smokeless tobacco: Never  Vaping Use   Vaping Use: Never used  Substance and Sexual Activity   Alcohol use: Yes    Comment: mixed drink 1-2 times per month   Drug use: No   Sexual activity: Yes    Birth control/protection: None  Other Topics Concern   Not on file  Social History Narrative   Live with wife   Social Determinants of Health   Financial Resource Strain: Low Risk  (11/05/2018)   Overall Financial Resource Strain  (CARDIA)    Difficulty of Paying Living Expenses: Not hard at all  Food Insecurity: No Food Insecurity (11/05/2018)   Hunger Vital Sign    Worried About Running Out of Food in the Last Year: Never true    Ran Out of Food in the Last Year: Never true  Transportation Needs: No Transportation Needs (11/05/2018)   PRAPARE - Hydrologist (Medical): No    Lack of Transportation (Non-Medical): No  Physical Activity: Insufficiently Active (11/05/2018)   Exercise Vital Sign    Days of Exercise per Week: 3 days    Minutes of Exercise per Session: 30 min  Stress: No Stress Concern Present (11/05/2018)   [place]    Feeling of Stress : Only a little  Social Connections: Socially Integrated (11/05/2018)   Social Connection and Isolation Panel [NHANES]    Frequency of Communication with Friends and Family: More than three times a week    Frequency of Social Gatherings with Friends and Family: More than three times a week    Attends Religious Services: More than 4 times per year    Active Member of [employer] or Organizations: Yes    Attends Archivist Meetings: 1 to 4 times per year    Marital Status: Married  Human resources officer Violence: Not At Risk (11/05/2018)   Humiliation, Afraid, Rape, and Kick questionnaire    Fear of Current or Ex-Partner: No    Emotionally Abused: No    Physically Abused: No    Sexually Abused: No    Vital Signs: There were no vitals taken for this visit. There is no height or weight on file to calculate BMI.   Examination: General Appearance: The patient is well-developed, well-nourished, and in no distress. Neck Circumference: 46cm Skin: Gross inspection of skin unremarkable. Head: normocephalic, no gross deformities. Eyes: no gross deformities noted. ENT: ears appear grossly normal Neurologic: Alert and oriented. No involuntary movements.    EPWORTH SLEEPINESS  SCALE:  Scale:  (0)= no chance of dozing; (1)= slight chance of dozing; (2)= moderate chance of dozing; (3)= high chance of dozing  Chance  Situtation    Sitting and reading: 1    Watching TV: 1    Sitting Inactive in public: 0    As a passenger in car: 0      Lying down to rest: 2    Sitting and talking: 0    Sitting quielty after lunch: 1    In a car, stopped in traffic: 0   TOTAL SCORE:   5 out of 24    SLEEP STUDIES:     LABS: No results found for this or any previous visit (from the past 2160 hour(s)).  Radiology: No results found.  No results found.  No results found.    Assessment and Plan: Patient Active Problem List   Diagnosis Date Noted   Numbness and tingling in  left hand 03/08/2017   Dizziness 03/08/2017   Nausea & vomiting 03/08/2017   Diabetes ([place]) 03/08/2017   TIA (transient ischemic attack) 01/25/2015   1. OSA (obstructive sleep apnea) PLAN OSA:   Patient evaluation suggests high risk of sleep disordered breathing due to observed apnea, gasping for breath, snoring.  Patient has comorbid cardiovascular risk factors including: hypertension, TIA which could be exacerbated by pathologic sleep-disordered breathing.  Suggest: PSG to assess/treat the patient's sleep disordered breathing. The patient was also counselled on weight loss to optimize sleep health.   2. Hypertension associated with diabetes ([place]) Hypertension Counseling:   The following hypertensive lifestyle modification were recommended and discussed:  1. Limiting alcohol intake to less than 1 oz/day of ethanol:(24 oz of beer or 8 oz of wine or 2 oz of 100-proof whiskey). 2. Take baby ASA 81 mg daily. 3. Importance of regular aerobic exercise and losing weight. 4. Reduce dietary saturated fat and cholesterol intake for overall cardiovascular health. 5. Maintaining adequate dietary potassium, calcium, and magnesium intake. 6. Regular monitoring of the blood pressure. 7.  Reduce sodium intake to less than 100 mmol/day (less than 2.3 gm of sodium or less than 6 gm of sodium choride)      General Counseling: I have discussed the findings of the evaluation and examination with [name].  I have also discussed any further diagnostic evaluation thatmay be needed or ordered today. [name] verbalizes understanding of the findings of todays visit. We also reviewed his medications today and discussed drug interactions and side effects including but not limited excessive drowsiness and altered mental states. We also discussed that there is always a risk not just to him but also people around him. he has been encouraged to call the office with any questions or concerns that should arise related to todays visit.  No orders of the defined types were placed in this encounter.       I have personally obtained a history, evaluated the patient, evaluated pertinent data, formulated the assessment and plan and placed orders.   This patient was seen today by Tressie Ellis, PA-C in collaboration with Dr. Devona Konig.   Allyne Gee, MD [facility] Diplomate ABMS Pulmonary and Critical Care Medicine Sleep medicine

## 2022-01-20 DIAGNOSIS — E785 Hyperlipidemia, unspecified: Secondary | ICD-10-CM | POA: Insufficient documentation

## 2022-03-08 DIAGNOSIS — I2510 Atherosclerotic heart disease of native coronary artery without angina pectoris: Secondary | ICD-10-CM | POA: Insufficient documentation

## 2022-06-27 ENCOUNTER — Ambulatory Visit (INDEPENDENT_AMBULATORY_CARE_PROVIDER_SITE_OTHER): Payer: Medicare Other | Admitting: Internal Medicine

## 2022-06-27 VITALS — BP 149/79 | HR 72 | Resp 14 | Ht 63.0 in | Wt 165.0 lb

## 2022-06-27 DIAGNOSIS — Z7189 Other specified counseling: Secondary | ICD-10-CM | POA: Diagnosis not present

## 2022-06-27 DIAGNOSIS — G4733 Obstructive sleep apnea (adult) (pediatric): Secondary | ICD-10-CM

## 2022-06-27 NOTE — Progress Notes (Signed)
[facility] [place], [place] [address]  Pulmonary Sleep Medicine   Office Visit Note  Patient Name: [name] DOB: 25-Oct-1955 MRN [id number]    Chief Complaint: Obstructive Sleep Apnea visit  Brief History:  Rohit is seen today for follow up 90 days after setup on APAP at 11-20 cmh20. The patient has a 6 month history of sleep apnea. Patient is using PAP nightly.  The patient feels rested after sleeping with PAP.  The patient reports benefiting from PAP use. Reported sleepiness is  improved and the Epworth Sleepiness Score is 5 out of 24. The patient does not take naps. The patient complains of the following: No complaints.  The compliance download shows 93% compliance with an average use time of 7 hours 17 minutes. The AHI is 2.6.  The patient does not complain of limb movements disrupting sleep.  ROS  General: (-) fever, (-) chills, (-) night sweat Nose and Sinuses: (-) nasal stuffiness or itchiness, (-) postnasal drip, (-) nosebleeds, (-) sinus trouble. Mouth and Throat: (-) sore throat, (-) hoarseness. Neck: (-) swollen glands, (-) enlarged thyroid, (-) neck pain. Respiratory: +cough, - shortness of breath, - wheezing. Neurologic: - numbness, - tingling. Psychiatric: - anxiety, - depression   Current Medication: Outpatient Encounter Medications as of 06/27/2022  Medication Sig   aspirin 325 MG tablet Take 81 mg by mouth daily.    aspirin EC 81 MG tablet Take by mouth.   atorvastatin (LIPITOR) 20 MG tablet Take 20 mg by mouth daily.   atorvastatin (LIPITOR) 40 MG tablet Take 1 tablet (40 mg total) by mouth daily. (Patient not taking: Reported on 11/06/2018)   hydrochlorothiazide (HYDRODIURIL) 25 MG tablet Take 25 mg by mouth daily.   insulin aspart (NOVOLOG) 100 UNIT/ML injection Inject into the skin 3 (three) times daily before meals. Sliding scale 1 unit per 10 carbs   Insulin Degludec FlexTouch 200 UNIT/ML SOPN SMARTSIG:0-80 Unit(s) SUB-Q  Daily   insulin detemir (LEVEMIR) 100 UNIT/ML injection Inject 0.2 mLs (20 Units total) into the skin every morning.   insulin detemir (LEVEMIR) 100 UNIT/ML injection Inject 0.18 mLs (18 Units total) into the skin at bedtime.   losartan (COZAAR) 50 MG tablet Take 50 mg by mouth daily.   Multiple Vitamin (MULTIVITAMIN WITH MINERALS) TABS tablet Take 1 tablet by mouth daily.   NOVOLOG FLEXPEN 100 UNIT/ML FlexPen SMARTSIG:0-50 Unit(s) SUB-Q 3 Times Daily   Omega-3 Fatty Acids (FISH OIL PO) Take 1 capsule by mouth daily.   No facility-administered encounter medications on file as of 06/27/2022.    Surgical History: Past Surgical History:  Procedure Laterality Date   COLONOSCOPY     COLONOSCOPY WITH PROPOFOL N/A 11/06/2018   Procedure: COLONOSCOPY WITH PROPOFOL;  Surgeon: Lollie Sails, MD;  Location: [facility] ENDOSCOPY;  Service: Endoscopy;  Laterality: N/A;    Medical History: Past Medical History:  Diagnosis Date   Diabetes mellitus without complication (HCC)    TIA (transient ischemic attack)     Family History: Non contributory to the present illness  Social History: Social History   Socioeconomic History   Marital status: Married    Spouse name: Not on file   Number of children: Not on file   Years of education: Not on file   Highest education level: Not on file  Occupational History   Occupation: teacher  Tobacco Use   Smoking status: Former    Types: Cigarettes    Quit date: 1990    Years since  quitting: 34.1   Smokeless tobacco: Never  Vaping Use   Vaping Use: Never used  Substance and Sexual Activity   Alcohol use: Yes    Comment: mixed drink 1-2 times per month   Drug use: No   Sexual activity: Yes    Birth control/protection: None  Other Topics Concern   Not on file  Social History Narrative   Live with wife   Social Determinants of Health   Financial Resource Strain: Low Risk  (11/05/2018)   Overall Financial Resource Strain (CARDIA)    Difficulty of  Paying Living Expenses: Not hard at all  Food Insecurity: No Food Insecurity (11/05/2018)   Hunger Vital Sign    Worried About Running Out of Food in the Last Year: Never true    Ran Out of Food in the Last Year: Never true  Transportation Needs: No Transportation Needs (11/05/2018)   PRAPARE - Hydrologist (Medical): No    Lack of Transportation (Non-Medical): No  Physical Activity: Insufficiently Active (11/05/2018)   Exercise Vital Sign    Days of Exercise per Week: 3 days    Minutes of Exercise per Session: 30 min  Stress: No Stress Concern Present (11/05/2018)   [place]    Feeling of Stress : Only a little  Social Connections: Socially Integrated (11/05/2018)   Social Connection and Isolation Panel [NHANES]    Frequency of Communication with Friends and Family: More than three times a week    Frequency of Social Gatherings with Friends and Family: More than three times a week    Attends Religious Services: More than 4 times per year    Active Member of [employer] or Organizations: Yes    Attends Archivist Meetings: 1 to 4 times per year    Marital Status: Married  Human resources officer Violence: Not At Risk (11/05/2018)   Humiliation, Afraid, Rape, and Kick questionnaire    Fear of Current or Ex-Partner: No    Emotionally Abused: No    Physically Abused: No    Sexually Abused: No    Vital Signs: Blood pressure (!) 149/79, pulse 72, resp. rate 14, height '5\' 3"'$  (1.6 m), weight 165 lb (74.8 kg), SpO2 95 %. Body mass index is 29.23 kg/m.    Examination: General Appearance: The patient is well-developed, well-nourished, and in no distress. Neck Circumference: 46 cm Skin: Gross inspection of skin unremarkable. Head: normocephalic, no gross deformities. Eyes: no gross deformities noted. ENT: ears appear grossly normal Neurologic: Alert and oriented. No involuntary movements.  STOP BANG  RISK ASSESSMENT S (snore) Have you been told that you snore?     NO   T (tired) Are you often tired, fatigued, or sleepy during the day?   NO  O (obstruction) Do you stop breathing, choke, or gasp during sleep? NO   P (pressure) Do you have or are you being treated for high blood pressure? YES   B (BMI) Is your body index greater than 35 kg/m? NO   A (age) Are you 26 years old or older? YES   N (neck) Do you have a neck circumference greater than 16 inches?   YES   G (gender) Are you a male? YES   TOTAL STOP/BANG "YES" ANSWERS 4       A STOP-Bang score of 2 or less is considered low risk, and a score of 5 or more is high risk for having either moderate  or severe OSA. For people who score 3 or 4, doctors may need to perform further assessment to determine how likely they are to have OSA.         EPWORTH SLEEPINESS SCALE:  Scale:  (0)= no chance of dozing; (1)= slight chance of dozing; (2)= moderate chance of dozing; (3)= high chance of dozing  Chance  Situtation    Sitting and reading: 1    Watching TV: 1    Sitting Inactive in public: 0    As a passenger in car: 1      Lying down to rest: 2    Sitting and talking: 0    Sitting quielty after lunch: 0    In a car, stopped in traffic: 0   TOTAL SCORE:   5 out of 24    SLEEP STUDIES:  PSG (12/30/21)  AHI 58.6, min SPO2 71%   CPAP COMPLIANCE DATA:  Date Range: 03/16/22 - 04/14/22  Average Daily Use: 7 hours 17 minutes  Median Use: 7 hours 32 minutes  Compliance for > 4 Hours: 28 days  AHI: 2.6 respiratory events per hour  Days Used: 29/30  Mask Leak: 19.2  95th Percentile Pressure: 14.4 cmh20         LABS: No results found for this or any previous visit (from the past 2160 hour(s)).  Radiology: No results found.  No results found.  No results found.    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 OSA on CPAP The patient does tolerate PAP and reports  benefit from PAP use. The patient was reminded how to clean equipment and advised to replace supplies routinely. The patient was also counselled on weight loss . The compliance is excellent. The AHI is 2.1.   OSA on cpap- controlled. Continue with excellent compliance with pap. CPAP continues to be medically necessary to treat this patient's OSA. F/u 6 months   2. CPAP use counseling CPAP Counseling: had a lengthy discussion with the patient regarding the importance of PAP therapy in management of the sleep apnea. Patient appears to understand the risk factor reduction and also understands the risks associated with untreated sleep apnea. Patient will try to make a good faith effort to remain compliant with therapy. Also instructed the patient on proper cleaning of the device including the water must be changed daily if possible and use of distilled water is preferred. Patient understands that the machine should be regularly cleaned with appropriate recommended cleaning solutions that do not damage the PAP machine for example given white vinegar and water rinses. Other methods such as ozone treatment may not be as good as these simple methods to achieve cleaning.     General  Counseling: I have discussed the findings of the evaluation and examination  with Remo Lipps.  I have also discussed any further diagnostic evaluation thatmay be needed or ordered today. Jerri verbalizes understanding of the findings of todays visit. We also reviewed his medications today and discussed drug interactions and side effects including but not limited excessive drowsiness and altered mental states. We also discussed that there is always a risk not just to him but also people around him. he has been encouraged to call the office with any questions or concerns that should arise related to todays visit.  No orders of the defined types were placed in this encounter.       I have personally obtained a history, examined the patient, evaluated laboratory and imaging results, formulated the assessment and plan and placed orders. This patient was seen today by Tressie Ellis, PA-C in collaboration with Dr. Devona Konig.   Allyne Gee, MD [facility] Diplomate ABMS Pulmonary Critical Care Medicine and Sleep Medicine

## 2022-06-27 NOTE — Patient Instructions (Signed)

## 2022-10-21 DIAGNOSIS — R7989 Other specified abnormal findings of blood chemistry: Secondary | ICD-10-CM | POA: Insufficient documentation

## 2022-12-26 ENCOUNTER — Ambulatory Visit (INDEPENDENT_AMBULATORY_CARE_PROVIDER_SITE_OTHER): Payer: Medicare Other | Admitting: Internal Medicine

## 2022-12-26 VITALS — BP 143/82 | HR 74 | Resp 14 | Ht 63.0 in | Wt 166.0 lb

## 2022-12-26 DIAGNOSIS — I152 Hypertension secondary to endocrine disorders: Secondary | ICD-10-CM | POA: Diagnosis not present

## 2022-12-26 DIAGNOSIS — G4733 Obstructive sleep apnea (adult) (pediatric): Secondary | ICD-10-CM | POA: Diagnosis not present

## 2022-12-26 DIAGNOSIS — Z7189 Other specified counseling: Secondary | ICD-10-CM | POA: Diagnosis not present

## 2022-12-26 DIAGNOSIS — E1159 Type 2 diabetes mellitus with other circulatory complications: Secondary | ICD-10-CM | POA: Diagnosis not present

## 2022-12-26 NOTE — Progress Notes (Unsigned)
[facility] [address] [place], [place] [address]  Pulmonary Sleep Medicine   Office Visit Note  Patient Name: [name] DOB: [birth year] MRN [id number]    Chief Complaint: Obstructive Sleep Apnea visit  Brief History:  [name] is seen today for a 6 month follow up on APAP at 11-20 cmh20.  The patient has a 1 year history of sleep apnea. Patient is using PAP nightly.  The patient feels rested after sleeping with PAP.  The patient reports benefiting from PAP use. Reported sleepiness is  improved and the Epworth Sleepiness Score is 3 out of 24. The patient does not take naps. The patient complains of the following: No complaints.  The compliance download shows 99% compliance with an average use time of 7:33 hours. The AHI is 1.9.  The patient does not complain of limb movements disrupting sleep.  ROS  General: (-) fever, (-) chills, (-) night sweat Nose and Sinuses: (-) nasal stuffiness or itchiness, (-) postnasal drip, (-) nosebleeds, (-) sinus trouble. Mouth and Throat: (-) sore throat, (-) hoarseness. Neck: (-) swollen glands, (-) enlarged thyroid, (-) neck pain. Respiratory: - cough, - shortness of breath, - wheezing. Neurologic: - numbness, - tingling. Psychiatric: - anxiety, - depression   Current Medication: Outpatient Encounter Medications as of 12/26/2022  Medication Sig   insulin lispro (HUMALOG) 100 UNIT/ML KwikPen Use up to 50 units/day, divided TID AC meals as per NP instructions,   LANTUS SOLOSTAR 100 UNIT/ML Solostar Pen Fill as Lantus. Inject up to 50u daily.   losartan (COZAAR) 100 MG tablet Take 1 tablet by mouth daily.   amLODipine (NORVASC) 10 MG tablet Take 10 mg by mouth daily.   aspirin EC 81 MG tablet Take by mouth.   atorvastatin (LIPITOR) 20 MG tablet Take 20 mg by mouth daily.   hydrochlorothiazide (HYDRODIURIL) 25 MG tablet Take 25 mg by mouth daily.   Insulin Degludec FlexTouch 200 UNIT/ML SOPN SMARTSIG:0-80 Unit(s) SUB-Q Daily    insulin detemir (LEVEMIR) 100 UNIT/ML injection Inject 0.2 mLs (20 Units total) into the skin every morning.   insulin detemir (LEVEMIR) 100 UNIT/ML injection Inject 0.18 mLs (18 Units total) into the skin at bedtime.   Multiple Vitamin (MULTIVITAMIN WITH MINERALS) TABS tablet Take 1 tablet by mouth daily.   Omega-3 Fatty Acids (FISH OIL PO) Take 1 capsule by mouth daily.   [DISCONTINUED] aspirin 325 MG tablet Take 81 mg by mouth daily.    [DISCONTINUED] atorvastatin (LIPITOR) 40 MG tablet Take 1 tablet (40 mg total) by mouth daily. (Patient not taking: Reported on 11/06/2018)   [DISCONTINUED] insulin aspart (NOVOLOG) 100 UNIT/ML injection Inject into the skin 3 (three) times daily before meals. Sliding scale 1 unit per 10 carbs   [DISCONTINUED] losartan (COZAAR) 50 MG tablet Take 50 mg by mouth daily.   [DISCONTINUED] NOVOLOG FLEXPEN 100 UNIT/ML FlexPen SMARTSIG:0-50 Unit(s) SUB-Q 3 Times Daily   No facility-administered encounter medications on file as of 12/26/2022.    Surgical History: Past Surgical History:  Procedure Laterality Date   COLONOSCOPY     COLONOSCOPY WITH PROPOFOL N/A 11/06/2018   Procedure: COLONOSCOPY WITH PROPOFOL;  Surgeon: Christena Deem, MD;  Location: [facility] ENDOSCOPY;  Service: Endoscopy;  Laterality: N/A;    Medical History: Past Medical History:  Diagnosis Date   Diabetes mellitus without complication (HCC)    TIA (transient ischemic attack)     Family History: Non contributory to the present illness  Social History: Social History   Socioeconomic History  Marital status: Married    Spouse name: Not on file   Number of children: Not on file   Years of education: Not on file   Highest education level: Not on file  Occupational History   Occupation: teacher  Tobacco Use   Smoking status: Former    Current packs/day: 0.00    Types: Cigarettes    Quit date: 1990    Years since quitting: 34.6   Smokeless tobacco: Never  Vaping Use   Vaping  status: Never Used  Substance and Sexual Activity   Alcohol use: Yes    Comment: mixed drink 1-2 times per month   Drug use: No   Sexual activity: Yes    Birth control/protection: None  Other Topics Concern   Not on file  Social History Narrative   Live with wife   Social Determinants of Health   Financial Resource Strain: Low Risk  (10/26/2022)   Received from [facility]   Overall Financial Resource Strain (CARDIA)    Difficulty of Paying Living Expenses: Not hard at all  Food Insecurity: No Food Insecurity (10/26/2022)   Received from [facility]   Hunger Vital Sign    Worried About Running Out of Food in the Last Year: Never true    Ran Out of Food in the Last Year: Never true  Transportation Needs: No Transportation Needs (10/26/2022)   Received from [facility]   PRAPARE - Transportation    Lack of Transportation (Medical): No    Lack of Transportation (Non-Medical): No  Physical Activity: Insufficiently Active (10/26/2022)   Received from [facility]   Exercise Vital Sign    Days of Exercise per Week: 1 day    Minutes of Exercise per Session: 90 min  Stress: No Stress Concern Present (10/26/2022)   Received from [facility] - Occupational Stress Questionnaire    Feeling of Stress : Not at all  Social Connections: Socially Integrated (10/26/2022)   Received from [facility]   Social Connection and Isolation Panel [NHANES]    Frequency of Communication with Friends and Family: More than three times a week    Frequency of Social Gatherings with Friends and Family: More than three times a week    Attends Religious Services: More than 4 times per year    Active Member of [employer] or Organizations: Yes    Attends Engineer, structural: More than 4 times per year    Marital Status: Married  Catering manager Violence: Not At Risk (10/26/2022)   Received from [facility]   Humiliation, Afraid, Rape, and  Kick questionnaire    Fear of Current or Ex-Partner: No    Emotionally Abused: No    Physically Abused: No    Sexually Abused: No    Vital Signs: Blood pressure (!) 143/82, pulse 74, resp. rate 14, height 5\' 3"  (1.6 m), weight 166 lb (75.3 kg), SpO2 97%. Body mass index is 29.41 kg/m.    Examination: General Appearance: The patient is well-developed, well-nourished, and in no distress. Neck Circumference: 46 cm Skin: Gross inspection of skin unremarkable. Head: normocephalic, no gross deformities. Eyes: no gross deformities noted. ENT: ears appear grossly normal Neurologic: Alert and oriented. No involuntary movements.  STOP BANG RISK ASSESSMENT S (snore) Have you been told that you snore?     NO   T (tired) Are you often tired, fatigued, or sleepy during the day?  NO  O (obstruction) Do you stop breathing, choke, or gasp during sleep? NO   P (pressure) Do you have or are you being treated for high blood pressure? YES   B (BMI) Is your body index greater than 35 kg/m? NO   A (age) Are you 58 years old or older? YES   N (neck) Do you have a neck circumference greater than 16 inches?   YES   G (gender) Are you a male? YES   TOTAL STOP/BANG "YES" ANSWERS 4       A STOP-Bang score of 2 or less is considered low risk, and a score of 5 or more is high risk for having either moderate or severe OSA. For people who score 3 or 4, doctors may need to perform further assessment to determine how likely they are to have OSA.         EPWORTH SLEEPINESS SCALE:  Scale:  (0)= no chance of dozing; (1)= slight chance of dozing; (2)= moderate chance of dozing; (3)= high chance of dozing  Chance  Situtation    Sitting and reading: 1    Watching TV: 0    Sitting Inactive in public: 0    As a passenger in car: 0      Lying down to rest: 2    Sitting and talking: 0    Sitting quielty after lunch: 0    In a car, stopped in traffic: 0   TOTAL SCORE:   3 out of  24    SLEEP STUDIES:  PSG (12/30/21) AHI 58.6, min SPO2 71%   CPAP COMPLIANCE DATA:  Date Range: 06/25/22 - 12/21/22  Average Daily Use: 7:33 hours  Median Use: 7:29 hours  Compliance for > 4 Hours: 179 days  AHI: 1.9 respiratory events per hour  Days Used: 180/180  Mask Leak: 13.1  95th Percentile Pressure: 14.8 cmh20         LABS: No results found for this or any previous visit (from the past 2160 hour(s)).  Radiology: No results found.  No results found.  No results found.    Assessment and Plan: Patient Active Problem List   Diagnosis Date Noted   OSA on CPAP 12/26/2022   CPAP use counseling 12/26/2022   Elevated LFTs 10/21/2022   Coronary artery disease due to calcified coronary lesion 03/08/2022   Hyperlipidemia associated with type 2 diabetes mellitus (HCC) 01/20/2022   OSA (obstructive sleep apnea) 11/29/2021   Hypertension associated with diabetes (HCC) 11/29/2021   Hypertension due to endocrine disorder 07/13/2021   NPDR with macular edema (HCC) 10/23/2020   Elevated alkaline phosphatase level 04/01/2020   Tubular adenoma of colon 11/06/2018   History of TIA (transient ischemic attack) 09/27/2018   Severe nonproliferative diabetic retinopathy of both eyes with macular edema associated with type 1 diabetes mellitus (HCC) 09/27/2018   Hyperlipidemia due to type 1 diabetes mellitus (HCC) 09/27/2018   Numbness and tingling in left hand 03/08/2017   Dizziness 03/08/2017   Nausea & vomiting 03/08/2017   Diabetes (HCC) 03/08/2017   TIA (transient ischemic attack) 01/25/2015   Erectile dysfunction 07/29/2014   1. OSA on CPAP The patient does tolerate PAP and reports  benefit from PAP use. The patient was reminded how to clean equipment and advised to replace supplies routinely. The patient was also counselled on weight loss. The compliance is excellent. The AHI is 1.9.   OSA on cpap- controlled. Continue with excellent compliance with pap.  CPAP continues to be  medically necessary to treat this patient's OSA. F/u one year.    2. CPAP use counseling CPAP Counseling: had a lengthy discussion with the patient regarding the importance of PAP therapy in management of the sleep apnea. Patient appears to understand the risk factor reduction and also understands the risks associated with untreated sleep apnea. Patient will try to make a good faith effort to remain compliant with therapy. Also instructed the patient on proper cleaning of the device including the water must be changed daily if possible and use of distilled water is preferred. Patient understands that the machine should be regularly cleaned with appropriate recommended cleaning solutions that do not damage the PAP machine for example given white vinegar and water rinses. Other methods such as ozone treatment may not be as good as these simple methods to achieve cleaning.   3. Hypertension associated with diabetes (HCC) Hypertension Counseling:   The following hypertensive lifestyle modification were recommended and discussed:  1. Limiting alcohol intake to less than 1 oz/day of ethanol:(24 oz of beer or 8 oz of wine or 2 oz of 100-proof whiskey). 2. Take baby ASA 81 mg daily. 3. Importance of regular aerobic exercise and losing weight. 4. Reduce dietary saturated fat and cholesterol intake for overall cardiovascular health. 5. Maintaining adequate dietary potassium, calcium, and magnesium intake. 6. Regular monitoring of the blood pressure. 7. Reduce sodium intake to less than 100 mmol/day (less than 2.3 gm of sodium or less than 6 gm of sodium choride)     General Counseling: I have discussed the findings of the evaluation and examination with Viviann Spare.  I have also discussed any further diagnostic evaluation thatmay be needed or ordered today. Willet verbalizes understanding of the findings of todays visit. We also reviewed his medications today and discussed drug interactions and  side effects including but not limited excessive drowsiness and altered mental states. We also discussed that there is always a risk not just to him but also people around him. he has been encouraged to call the office with any questions or concerns that should arise related to todays visit.  No orders of the defined types were placed in this encounter.       I have personally obtained a history, examined the patient, evaluated laboratory and imaging results, formulated the assessment and plan and placed orders.  This patient was seen today by Emmaline Kluver, PA-C in collaboration with Dr. Freda Munro.  Yevonne Pax, MD [facility] Diplomate ABMS Pulmonary Critical Care Medicine and Sleep Medicine

## 2022-12-26 NOTE — Patient Instructions (Signed)

## 2024-01-08 SURGERY — Surgical Case
Anesthesia: *Unknown

## 2024-01-11 ENCOUNTER — Encounter: Payer: Self-pay | Admitting: Ophthalmology

## 2024-01-11 NOTE — Anesthesia Preprocedure Evaluation (Addendum)
 Anesthesia Evaluation  Patient identified by MRN, date of birth, ID band Patient awake    Reviewed: Allergy & Precautions, H&P , NPO status , Patient's Chart, lab work & pertinent test results  Airway Mallampati: III  TM Distance: >3 FB Neck ROM: Full    Dental no notable dental hx. (+) Chipped   Pulmonary neg pulmonary ROS, sleep apnea , former smoker   Pulmonary exam normal breath sounds clear to auscultation       Cardiovascular hypertension, + CAD  negative cardio ROS Normal cardiovascular exam Rhythm:Regular Rate:Normal     Neuro/Psych   Anxiety     TIA Neuromuscular disease negative neurological ROS  negative psych ROS   GI/Hepatic negative GI ROS, Neg liver ROS,,,  Endo/Other  negative endocrine ROSdiabetes    Renal/GU negative Renal ROS  negative genitourinary   Musculoskeletal negative musculoskeletal ROS (+) Arthritis ,    Abdominal   Peds negative pediatric ROS (+)  Hematology negative hematology ROS (+)   Anesthesia Other Findings Diabetes mellitus without complication  TIA (transient ischemic attack) Hypertension  Sleep apnea Cancer Neuromuscular disorder Arthritis  Anxiety    Reproductive/Obstetrics negative OB ROS                              Anesthesia Physical Anesthesia Plan  ASA: 3  Anesthesia Plan: MAC   Post-op Pain Management:    Induction: Intravenous  PONV Risk Score and Plan:   Airway Management Planned: Natural Airway and Nasal Cannula  Additional Equipment:   Intra-op Plan:   Post-operative Plan:   Informed Consent: I have reviewed the patients History and Physical, chart, labs and discussed the procedure including the risks, benefits and alternatives for the proposed anesthesia with the patient or authorized representative who has indicated his/her understanding and acceptance.     Dental Advisory Given  Plan Discussed with:  Anesthesiologist, CRNA and Surgeon  Anesthesia Plan Comments: (Patient consented for risks of anesthesia including but not limited to:  - adverse reactions to medications - damage to eyes, teeth, lips or other oral mucosa - nerve damage due to positioning  - sore throat or hoarseness - Damage to heart, brain, nerves, lungs, other parts of body or loss of life  Patient voiced understanding and assent.)        Anesthesia Quick Evaluation

## 2024-01-17 NOTE — Discharge Instructions (Signed)

## 2024-01-18 ENCOUNTER — Ambulatory Visit: Payer: Self-pay | Admitting: Anesthesiology

## 2024-01-18 ENCOUNTER — Encounter: Payer: Self-pay | Admitting: Ophthalmology

## 2024-01-18 ENCOUNTER — Other Ambulatory Visit: Payer: Self-pay

## 2024-01-18 ENCOUNTER — Encounter
Admission: RE | Disposition: A | Discharge status: Home / Self Care | Payer: Self-pay | Source: Home / Self Care | Attending: Ophthalmology

## 2024-01-18 ENCOUNTER — Ambulatory Visit
Admission: RE | Admit: 2024-01-18 | Discharge: 2024-01-18 | Disposition: A | Discharge status: Home / Self Care | Attending: Ophthalmology | Admitting: Ophthalmology

## 2024-01-18 DIAGNOSIS — Z794 Long term (current) use of insulin: Secondary | ICD-10-CM | POA: Insufficient documentation

## 2024-01-18 DIAGNOSIS — G4730 Sleep apnea, unspecified: Secondary | ICD-10-CM | POA: Insufficient documentation

## 2024-01-18 DIAGNOSIS — H2512 Age-related nuclear cataract, left eye: Secondary | ICD-10-CM | POA: Insufficient documentation

## 2024-01-18 DIAGNOSIS — M1990 Unspecified osteoarthritis, unspecified site: Secondary | ICD-10-CM | POA: Diagnosis not present

## 2024-01-18 DIAGNOSIS — Z87891 Personal history of nicotine dependence: Secondary | ICD-10-CM | POA: Diagnosis not present

## 2024-01-18 DIAGNOSIS — G709 Myoneural disorder, unspecified: Secondary | ICD-10-CM | POA: Insufficient documentation

## 2024-01-18 DIAGNOSIS — I10 Essential (primary) hypertension: Secondary | ICD-10-CM | POA: Diagnosis not present

## 2024-01-18 DIAGNOSIS — Z8673 Personal history of transient ischemic attack (TIA), and cerebral infarction without residual deficits: Secondary | ICD-10-CM | POA: Diagnosis not present

## 2024-01-18 DIAGNOSIS — I2510 Atherosclerotic heart disease of native coronary artery without angina pectoris: Secondary | ICD-10-CM | POA: Diagnosis not present

## 2024-01-18 DIAGNOSIS — E1036 Type 1 diabetes mellitus with diabetic cataract: Secondary | ICD-10-CM | POA: Insufficient documentation

## 2024-01-18 HISTORY — DX: Myoneural disorder, unspecified: G70.9

## 2024-01-18 HISTORY — DX: Malignant (primary) neoplasm, unspecified: C80.1

## 2024-01-18 HISTORY — DX: Sleep apnea, unspecified: G47.30

## 2024-01-18 HISTORY — DX: Essential (primary) hypertension: I10

## 2024-01-18 HISTORY — DX: Anxiety disorder, unspecified: F41.9

## 2024-01-18 HISTORY — DX: Unspecified osteoarthritis, unspecified site: M19.90

## 2024-01-18 LAB — GLUCOSE, CAPILLARY: Glucose-Capillary: 193 mg/dL — ABNORMAL HIGH (ref 70–99)

## 2024-01-18 SURGERY — PHACOEMULSIFICATION, CATARACT, WITH IOL INSERTION
Anesthesia: Monitor Anesthesia Care | Site: Eye | Laterality: Left

## 2024-01-18 MED ORDER — ARMC OPHTHALMIC DILATING DROPS
1.0000 | OPHTHALMIC | Status: DC | PRN
  Administered 2024-01-18 (×3): 1 via OPHTHALMIC

## 2024-01-18 MED ORDER — ARMC OPHTHALMIC DILATING DROPS
OPHTHALMIC | Status: AC
  Filled 2024-01-18: qty 0.5

## 2024-01-18 MED ORDER — BRIMONIDINE TARTRATE-TIMOLOL 0.2-0.5 % OP SOLN
OPHTHALMIC | Status: DC | PRN
  Administered 2024-01-18: 1 [drp] via OPHTHALMIC

## 2024-01-18 MED ORDER — MIDAZOLAM HCL 2 MG/2ML IJ SOLN
INTRAMUSCULAR | Status: DC | PRN
  Administered 2024-01-18 (×2): 1 mg via INTRAVENOUS

## 2024-01-18 MED ORDER — FENTANYL CITRATE (PF) 100 MCG/2ML IJ SOLN
INTRAMUSCULAR | Status: AC
  Filled 2024-01-18: qty 2

## 2024-01-18 MED ORDER — TETRACAINE HCL 0.5 % OP SOLN
1.0000 [drp] | OPHTHALMIC | Status: DC | PRN
  Administered 2024-01-18 (×3): 1 [drp] via OPHTHALMIC

## 2024-01-18 MED ORDER — TETRACAINE HCL 0.5 % OP SOLN
OPHTHALMIC | Status: AC
  Filled 2024-01-18: qty 4

## 2024-01-18 MED ORDER — LACTATED RINGERS IV SOLN
INTRAVENOUS | Status: DC
Start: 2024-01-18 — End: 2024-01-18

## 2024-01-18 MED ORDER — FENTANYL CITRATE (PF) 100 MCG/2ML IJ SOLN
INTRAMUSCULAR | Status: DC | PRN
  Administered 2024-01-18: 50 ug via INTRAVENOUS

## 2024-01-18 MED ORDER — MIDAZOLAM HCL 2 MG/2ML IJ SOLN
INTRAMUSCULAR | Status: AC
  Filled 2024-01-18: qty 2

## 2024-01-18 MED ORDER — LIDOCAINE HCL (PF) 2 % IJ SOLN
INTRAOCULAR | Status: DC | PRN
  Administered 2024-01-18: 4 mL via INTRAOCULAR

## 2024-01-18 MED ORDER — MOXIFLOXACIN HCL 0.5 % OP SOLN
OPHTHALMIC | Status: DC | PRN
  Administered 2024-01-18: .2 mL via OPHTHALMIC

## 2024-01-18 MED ORDER — SIGHTPATH DOSE#1 NA HYALUR & NA CHOND-NA HYALUR IO KIT
PACK | INTRAOCULAR | Status: DC | PRN
  Administered 2024-01-18: 1 via OPHTHALMIC

## 2024-01-18 MED ORDER — SIGHTPATH DOSE#1 BSS IO SOLN
INTRAOCULAR | Status: DC | PRN
  Administered 2024-01-18: 121 mL via OPHTHALMIC

## 2024-01-18 MED ORDER — SIGHTPATH DOSE#1 BSS IO SOLN
INTRAOCULAR | Status: DC | PRN
  Administered 2024-01-18: 15 mL via INTRAOCULAR

## 2024-01-18 SURGICAL SUPPLY — 10 items
DISSECTOR HYDRO NUCLEUS 50X22 (MISCELLANEOUS) ×1 IMPLANT
DRSG TEGADERM 2-3/8X2-3/4 SM (GAUZE/BANDAGES/DRESSINGS) ×1 IMPLANT
FEE CATARACT SUITE SIGHTPATH (MISCELLANEOUS) ×1 IMPLANT
GLOVE BIOGEL PI IND STRL 8 (GLOVE) ×1 IMPLANT
GLOVE SURG LX STRL 7.5 STRW (GLOVE) ×1 IMPLANT
GLOVE SURG SYN 6.5 PF PI BL (GLOVE) ×1 IMPLANT
LENS IOL TECNIS MONO 21.5 (Intraocular Lens) IMPLANT
NDL FILTER BLUNT 18X1 1/2 (NEEDLE) ×1 IMPLANT
NEEDLE FILTER BLUNT 18X1 1/2 (NEEDLE) ×1 IMPLANT
SYR 3ML LL SCALE MARK (SYRINGE) ×1 IMPLANT

## 2024-01-18 NOTE — H&P (Signed)
 [facility]   Primary Care Physician:  [facility] Ophthalmologist: Dr. Feliciano Ober  Pre-Procedure History & Physical: HPI:  [name] is a 68 y.o. male here for cataract surgery.   Past Medical History:  Diagnosis Date   Anxiety    Arthritis    hips   Cancer (HCC)    skin cancer on face   Diabetes mellitus without complication (HCC)    type 1   Hypertension    Neuromuscular disorder (HCC)    neuropathy in feet   Sleep apnea    uses CPAP   TIA (transient ischemic attack)     Past Surgical History:  Procedure Laterality Date   COLONOSCOPY     COLONOSCOPY WITH PROPOFOL  N/A 11/06/2018   Procedure: COLONOSCOPY WITH PROPOFOL ;  Surgeon: Gaylyn Gladis PENNER, MD;  Location: [facility] ENDOSCOPY;  Service: Endoscopy;  Laterality: N/A;    Prior to Admission medications   Medication Sig Start Date End Date Taking? Authorizing Provider  Apple Cider Vinegar 500 MG TABS Take 1 tablet by mouth daily.   Yes [provider]  Cholecalciferol (D3) 25 MCG (1000 UT) capsule Take 1,000 Units by mouth daily.   Yes [provider]  Milk Thistle 300 MG CAPS Take 1 capsule by mouth daily.   Yes [provider]  Multiple Vitamins-Minerals (EQ VISION FORMULA 50+ PO) Take 1 tablet by mouth daily.   Yes [provider]  Multiple Vitamins-Minerals (MEGAVITE FRUITS & VEGGIES PO) Take 1 tablet by mouth daily.   Yes [provider]  Multiple Vitamins-Minerals (MENS 50+ MULTI VITAMIN/MIN PO) Take 1 tablet by mouth daily.   Yes [provider]  amLODipine (NORVASC) 10 MG tablet Take 10 mg by mouth daily.    [provider]  aspirin  EC 81 MG tablet Take by mouth. 04/20/09   [provider]  atorvastatin  (LIPITOR) 20 MG tablet Take 20 mg by mouth daily. 10/07/21   [provider]  hydrochlorothiazide (HYDRODIURIL) 25 MG tablet Take 25 mg by mouth daily. 10/15/21   [provider]  Insulin  Degludec FlexTouch  200 UNIT/ML SOPN SMARTSIG:0-80 Unit(s) SUB-Q Daily 10/19/21   [provider]  insulin  lispro (HUMALOG) 100 UNIT/ML KwikPen Use up to 50 units/day, divided TID AC meals as per NP instructions, 09/29/22   [provider]  LANTUS SOLOSTAR 100 UNIT/ML Solostar Pen Fill as Lantus. Inject up to 50u daily. 09/29/22   [provider]  losartan (COZAAR) 100 MG tablet Take 1 tablet by mouth daily. 12/16/22 12/16/23  [provider]  Multiple Vitamin (MULTIVITAMIN WITH MINERALS) TABS tablet Take 1 tablet by mouth daily.    [provider]  Omega-3 Fatty Acids (FISH OIL PO) Take 1 capsule by mouth daily.    [provider]    Allergies as of 12/25/2023   (No Known Allergies)    Family History  Problem Relation Age of Onset   Heart attack Father    Stroke Paternal Grandfather     Social History   Socioeconomic History   Marital status: Married    Spouse name: Not on file   Number of children: Not on file   Years of education: Not on file   Highest education level: Not on file  Occupational History   Occupation: teacher  Tobacco Use   Smoking status: Former    Current packs/day: 0.00    Types: Cigarettes    Quit date: 1990    Years since quitting: 35.7  Smokeless tobacco: Never  Vaping Use   Vaping status: Never Used  Substance and Sexual Activity   Alcohol use: Yes    Comment: mixed drink 1-2 times per month   Drug use: Never   Sexual activity: Yes    Birth control/protection: None  Other Topics Concern   Not on file  Social History Narrative   Live with wife   Social Drivers of Health   Financial Resource Strain: Low Risk  (11/24/2023)   Received from [facility]   Overall Financial Resource Strain (CARDIA)    How hard is it for you to pay for the very basics like food, housing, medical care, and heating?: Not very hard  Food Insecurity: No Food Insecurity (11/24/2023)   Received from [facility]   Hunger Vital Sign     Within the past 12 months, the food you bought just didn't last and you didn't have money to get more.: Never true    Within the past 12 months, you worried that your food would run out before you got the money to buy more.: Never true  Transportation Needs: No Transportation Needs (11/24/2023)   Received from [facility]   PRAPARE - Transportation    Lack of Transportation (Non-Medical): No    Lack of Transportation (Medical): No  Physical Activity: Inactive (09/26/2023)   Received from [facility]   Exercise Vital Sign    On average, how many minutes do you engage in exercise at this level?: 0 min    On average, how many days per week do you engage in moderate to strenuous exercise (like a brisk walk)?: 0 days  Stress: No Stress Concern Present (09/26/2023)   Received from [facility] - Occupational Stress Questionnaire    Feeling of Stress : Not at all  Social Connections: Socially Integrated (09/26/2023)   Received from [facility]   Social Connection and Isolation Panel    Are you married, widowed, divorced, separated, never married, or living with a partner?: Married    How often do you attend meetings of the clubs or organizations you belong to?: More than 4 times per year    Do you belong to any clubs or organizations such as church groups, unions, fraternal or athletic groups, or school groups?: Yes    How often do you attend church or religious services?: More than 4 times per year    How often do you get together with friends or relatives?: More than three times a week    In a typical week, how many times do you talk on the phone with family, friends, or neighbors?: Three times a week  Intimate Partner Violence: Not At Risk (09/26/2023)   Received from [facility]   Humiliation, Afraid, Rape, and Kick questionnaire    Within the last year, have you been raped or forced to have any kind of sexual activity by your  partner or ex-partner?: No    Within the last year, have you been kicked, hit, slapped, or otherwise physically hurt by your partner or ex-partner?: No    Within the last year, have you been humiliated or emotionally abused in other ways by your partner or ex-partner?: No    Within the last year, have you been afraid of your partner or ex-partner?: No    Review of Systems: See HPI, otherwise negative ROS  Physical Exam: Ht 5' 3 (1.6 m)  Wt 71.7 kg   BMI 27.99 kg/m  General:   Alert, cooperative in NAD Head:  Normocephalic and atraumatic. Respiratory:  Normal work of breathing. Cardiovascular:  RRR  Impression/Plan: [name] is here for cataract surgery.  Risks, benefits, limitations, and alternatives regarding cataract surgery have been reviewed with the patient.  Questions have been answered.  All parties agreeable.   Feliciano Bryan Ober, MD  01/18/2024, 7:15 AM

## 2024-01-18 NOTE — Transfer of Care (Signed)
 Immediate Anesthesia Transfer of Care Note  Patient: [name]  Procedure(s) Performed: PHACOEMULSIFICATION, CATARACT, WITH IOL INSERTION 9.32 01:18.1 (Left: Eye)  Patient Location: PACU  Anesthesia Type: MAC  Level of Consciousness: awake, alert  and patient cooperative  Airway and Oxygen Therapy: Patient Spontanous Breathing and Patient connected to supplemental oxygen  Post-op Assessment: Post-op Vital signs reviewed, Patient's Cardiovascular Status Stable, Respiratory Function Stable, Patent Airway and No signs of Nausea or vomiting  Post-op Vital Signs: Reviewed and stable  Complications: No notable events documented.

## 2024-01-18 NOTE — Op Note (Signed)
 OPERATIVE NOTE  [name] [id number] 01/18/2024   PREOPERATIVE DIAGNOSIS: Nuclear sclerotic cataract left eye. H25.12   POSTOPERATIVE DIAGNOSIS: Nuclear sclerotic cataract left eye. H25.12   PROCEDURE:  Phacoemulsification with posterior chamber intraocular lens placement of the left eye  Ultrasound time: Procedure(s): PHACOEMULSIFICATION, CATARACT, WITH IOL INSERTION 9.32 01:18.1 (Left)  LENS:   Implant Name Type Inv. Item Serial No. Manufacturer Lot No. LRB No. Used Action  LENS IOL TECNIS MONO 21.5 - [id number] Intraocular Lens LENS IOL TECNIS MONO 21.5 [id number] SIGHTPATH  Left 1 Implanted      SURGEON:  Feliciano HERO. Enola, MD   ANESTHESIA:  Topical with tetracaine  drops, augmented with 1% preservative-free intracameral lidocaine .   COMPLICATIONS:  None.   DESCRIPTION OF PROCEDURE:  The patient was identified in the holding room and transported to the operating room and placed in the supine position under the operating microscope.  The left eye was identified as the operative eye, which was prepped and draped in the usual sterile ophthalmic fashion.   A 1 millimeter clear-corneal paracentesis was made inferotemporally. Preservative-free 1% lidocaine  mixed with 1:1,000 bisulfite-free aqueous solution of epinephrine  was injected into the anterior chamber. The anterior chamber was then filled with Viscoat viscoelastic. A 2.4 millimeter keratome was used to make a clear-corneal incision superotemporally. A curvilinear capsulorrhexis was made with a cystotome and capsulorrhexis forceps. Balanced salt solution was used to hydrodissect and hydrodelineate the nucleus. Phacoemulsification was then used to remove the lens nucleus and epinucleus. The remaining cortex was then removed using the irrigation and aspiration handpiece. Provisc was then placed into the capsular bag to distend it for lens placement. A +21.50 D [id number] intraocular lens was then injected into the capsular bag. The  remaining viscoelastic was aspirated.   Wounds were hydrated with balanced salt solution.  The anterior chamber was inflated to a physiologic pressure with balanced salt solution.  No wound leaks were noted. Moxifloxacin  was injected intracamerally.  Timolol  and Brimonidine  drops were applied to the eye.  The patient was taken to the recovery room in stable condition without complications of anesthesia or surgery.  Feliciano Hugger Talent 01/18/2024, 10:07 AM

## 2024-01-18 NOTE — Anesthesia Postprocedure Evaluation (Signed)
 Anesthesia Post Note  Patient: HANAN MOEN  Procedure(s) Performed: PHACOEMULSIFICATION, CATARACT, WITH IOL INSERTION 9.32 01:18.1 (Left: Eye)  Patient location during evaluation: PACU Anesthesia Type: MAC Level of consciousness: awake and alert Pain management: pain level controlled Vital Signs Assessment: post-procedure vital signs reviewed and stable Respiratory status: spontaneous breathing, nonlabored ventilation, respiratory function stable and patient connected to nasal cannula oxygen Cardiovascular status: stable and blood pressure returned to baseline Postop Assessment: no apparent nausea or vomiting Anesthetic complications: no   No notable events documented.   Last Vitals:  Vitals:   01/18/24 1009 01/18/24 1013  BP: 120/64   Pulse: 70   Resp: 20   Temp:  (!) (P) 36.1 C  SpO2: 96%     Last Pain:  Vitals:   01/18/24 0843  TempSrc: Temporal  PainSc: 0-No pain                 Laci Frenkel C Lew Prout

## 2024-02-02 NOTE — Progress Notes (Unsigned)
 [facility] [address] [place], [place] [address]  Pulmonary Sleep Medicine   Office Visit Note  Patient Name: [name] DOB: [birth year] MRN [id number]    Chief Complaint: Obstructive Sleep Apnea visit  Brief History:  [name] is seen today for an annual follow up visit for APAP@ 11-20 cmH2O. The patient has a 2 year history of sleep apnea. Patient is using PAP nightly.  The patient feels rested after sleeping with PAP.  The patient reports benefiting from PAP use. Reported sleepiness is improved and the Epworth Sleepiness Score is 3 out of 24. The patient does not take naps. The patient complains of the following: none.  The compliance download shows 82% compliance with an average use time of 6 hours 16 minutes. The AHI is 1.9.  The patient does not complain of limb movements disrupting sleep. The patient continues to require PAP therapy in order to eliminate sleep apnea.   ROS  General: (-) fever, (-) chills, (-) night sweat Nose and Sinuses: (-) nasal stuffiness or itchiness, (-) postnasal drip, (-) nosebleeds, (-) sinus trouble. Mouth and Throat: (-) sore throat, (-) hoarseness. Neck: (-) swollen glands, (-) enlarged thyroid, (-) neck pain. Respiratory: - cough, - shortness of breath, - wheezing. Neurologic: - numbness, - tingling. Psychiatric: - anxiety, - depression   Current Medication: Outpatient Encounter Medications as of 02/05/2024  Medication Sig   amLODipine (NORVASC) 10 MG tablet Take 10 mg by mouth daily.   Apple Cider Vinegar 500 MG TABS Take 1 tablet by mouth daily.   aspirin  EC 81 MG tablet Take by mouth.   atorvastatin  (LIPITOR) 20 MG tablet Take 20 mg by mouth daily.   Cholecalciferol (D3) 25 MCG (1000 UT) capsule Take 1,000 Units by mouth daily.   hydrochlorothiazide (HYDRODIURIL) 25 MG tablet Take 25 mg by mouth daily.   Insulin  Degludec FlexTouch 200 UNIT/ML SOPN SMARTSIG:0-80 Unit(s) SUB-Q Daily   insulin  lispro (HUMALOG) 100 UNIT/ML  KwikPen Use up to 50 units/day, divided TID AC meals as per NP instructions,   LANTUS SOLOSTAR 100 UNIT/ML Solostar Pen Fill as Lantus. Inject up to 50u daily.   losartan (COZAAR) 100 MG tablet Take 1 tablet by mouth daily.   Milk Thistle 300 MG CAPS Take 1 capsule by mouth daily.   Multiple Vitamin (MULTIVITAMIN WITH MINERALS) TABS tablet Take 1 tablet by mouth daily.   Multiple Vitamins-Minerals (EQ VISION FORMULA 50+ PO) Take 1 tablet by mouth daily.   Multiple Vitamins-Minerals (MEGAVITE FRUITS & VEGGIES PO) Take 1 tablet by mouth daily.   Multiple Vitamins-Minerals (MENS 50+ MULTI VITAMIN/MIN PO) Take 1 tablet by mouth daily.   Omega-3 Fatty Acids (FISH OIL PO) Take 1 capsule by mouth daily.   No facility-administered encounter medications on file as of 02/05/2024.    Surgical History: Past Surgical History:  Procedure Laterality Date   CATARACT EXTRACTION W/PHACO Left 01/18/2024   Procedure: PHACOEMULSIFICATION, CATARACT, WITH IOL INSERTION 9.32 01:18.1;  Surgeon: Troy Feliciano Hugger, MD;  Location: [facility];  Service: Ophthalmology;  Laterality: Left;   COLONOSCOPY     COLONOSCOPY WITH PROPOFOL  N/A 11/06/2018   Procedure: COLONOSCOPY WITH PROPOFOL ;  Surgeon: Troy Gladis PENNER, MD;  Location: [facility];  Service: Endoscopy;  Laterality: N/A;    Medical History: Past Medical History:  Diagnosis Date   Anxiety    Arthritis    hips   Cancer (HCC)    skin cancer on face   Diabetes mellitus without complication (HCC)  type 1   Hypertension    Neuromuscular disorder (HCC)    neuropathy in feet   Sleep apnea    uses CPAP   TIA (transient ischemic attack)     Family History: Non contributory to the present illness  Social History: Social History   Socioeconomic History   Marital status: Married    Spouse name: Not on file   Number of children: Not on file   Years of education: Not on file   Highest education level: Not on file  Occupational History    Occupation: Runner, broadcasting/film/video  Tobacco Use   Smoking status: Former    Current packs/day: 0.00    Types: Cigarettes    Quit date: 1990    Years since quitting: 35.7   Smokeless tobacco: Never  Vaping Use   Vaping status: Never Used  Substance and Sexual Activity   Alcohol use: Yes    Comment: mixed drink 1-2 times per month   Drug use: Never   Sexual activity: Yes    Birth control/protection: None  Other Topics Concern   Not on file  Social History Narrative   Live with wife   Social Drivers of Health   Financial Resource Strain: Low Risk  (11/24/2023)   Received from [facility]   Overall Financial Resource Strain (CARDIA)    How hard is it for you to pay for the very basics like food, housing, medical care, and heating?: Not very hard  Food Insecurity: No Food Insecurity (11/24/2023)   Received from [facility]   Hunger Vital Sign    Within the past 12 months, the food you bought just didn't last and you didn't have money to get more.: Never true    Within the past 12 months, you worried that your food would run out before you got the money to buy more.: Never true  Transportation Needs: No Transportation Needs (11/24/2023)   Received from [facility]   PRAPARE - Transportation    Lack of Transportation (Non-Medical): No    Lack of Transportation (Medical): No  Physical Activity: Inactive (09/26/2023)   Received from [facility]   Exercise Vital Sign    On average, how many minutes do you engage in exercise at this level?: 0 min    On average, how many days per week do you engage in moderate to strenuous exercise (like a brisk walk)?: 0 days  Stress: No Stress Concern Present (09/26/2023)   Received from [facility] - Occupational Stress Questionnaire    Feeling of Stress : Not at all  Social Connections: Socially Integrated (09/26/2023)   Received from [facility]   Social Connection and Isolation Panel    Are  you married, widowed, divorced, separated, never married, or living with a partner?: Married    How often do you attend meetings of the clubs or organizations you belong to?: More than 4 times per year    Do you belong to any clubs or organizations such as church groups, unions, fraternal or athletic groups, or school groups?: Yes    How often do you attend church or religious services?: More than 4 times per year    How often do you get together with friends or relatives?: More than three times a week    In a typical week, how many times do you talk on the phone with family, friends, or neighbors?: Three times a week  Intimate  Partner Violence: Not At Risk (09/26/2023)   Received from [facility]   Humiliation, Afraid, Rape, and Kick questionnaire    Within the last year, have you been raped or forced to have any kind of sexual activity by your partner or ex-partner?: No    Within the last year, have you been kicked, hit, slapped, or otherwise physically hurt by your partner or ex-partner?: No    Within the last year, have you been humiliated or emotionally abused in other ways by your partner or ex-partner?: No    Within the last year, have you been afraid of your partner or ex-partner?: No    Vital Signs: Blood pressure 125/70, pulse 84, resp. rate 16, height 5' 3 (1.6 m), weight 162 lb (73.5 kg), SpO2 96%. Body mass index is 28.7 kg/m.    Examination: General Appearance: The patient is well-developed, well-nourished, and in no distress. Neck Circumference: 46 cm Skin: Gross inspection of skin unremarkable. Head: normocephalic, no gross deformities. Eyes: no gross deformities noted. ENT: ears appear grossly normal Neurologic: Alert and oriented. No involuntary movements.  STOP BANG RISK ASSESSMENT S (snore) Have you been told that you snore?     NO   T (tired) Are you often tired, fatigued, or sleepy during the day?   NO  O (obstruction) Do you stop breathing, choke, or gasp  during sleep? NO   P (pressure) Do you have or are you being treated for high blood pressure? YES   B (BMI) Is your body index greater than 35 kg/m? NO   A (age) Are you 63 years old or older? YES   N (neck) Do you have a neck circumference greater than 16 inches?   YES   G (gender) Are you a male? YES   TOTAL STOP/BANG "YES" ANSWERS 4       A STOP-Bang score of 2 or less is considered low risk, and a score of 5 or more is high risk for having either moderate or severe OSA. For people who score 3 or 4, doctors may need to perform further assessment to determine how likely they are to have OSA.         EPWORTH SLEEPINESS SCALE:  Scale:  (0)= no chance of dozing; (1)= slight chance of dozing; (2)= moderate chance of dozing; (3)= high chance of dozing  Chance  Situtation    Sitting and reading: 1    Watching TV: 1    Sitting Inactive in public: 0    As a passenger in car: 0      Lying down to rest: 1    Sitting and talking: 0    Sitting quielty after lunch: 0    In a car, stopped in traffic: 0   TOTAL SCORE:   3 out of 24    SLEEP STUDIES:  PSG (12/30/2021) AHI 58.6/Hr, min Sp02 71% Titration (01/13/2022) APAP @ 11-20 cmH2O   CPAP COMPLIANCE DATA:  Date Range: 02/02/2023-02/01/2024  Average Daily Use: 6 hours 16 minutes  Median Use: 7 hours 3 minutes  Compliance for > 4 Hours: 82%  AHI: 1.9 respiratory events per hour  Days Used: 353/365 days  Mask Leak: 10.8  95th Percentile Pressure: 14.8         LABS: Recent Results (from the past 2160 hours)  Glucose, capillary     Status: Abnormal   Collection Time: 01/18/24  8:42 AM  Result Value Ref Range   Glucose-Capillary 193 (H) 70 - 99  mg/dL    Comment: Glucose reference range applies only to samples taken after fasting for at least 8 hours.    Radiology: No results found.  No results found.  No results found.    Assessment and Plan: Patient Active Problem List   Diagnosis Date  Noted   OSA on CPAP 12/26/2022   CPAP use counseling 12/26/2022   Elevated LFTs 10/21/2022   Coronary artery disease due to calcified coronary lesion 03/08/2022   Hyperlipidemia associated with type 2 diabetes mellitus (HCC) 01/20/2022   OSA (obstructive sleep apnea) 11/29/2021   Hypertension associated with diabetes (HCC) 11/29/2021   Hypertension due to endocrine disorder 07/13/2021   NPDR with macular edema (HCC) 10/23/2020   Elevated alkaline phosphatase level 04/01/2020   Tubular adenoma of colon 11/06/2018   History of TIA (transient ischemic attack) 09/27/2018   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 OSA on CPAP (Primary) The patient does tolerate PAP and reports  benefit from PAP use. The patient was reminded how to clean equipment and advised to replace supplies routinely. The patient was also counselled on weight loss. The compliance is good. The AHI is 1.9.   OSA on cpap- controlled. Continue withcompliance with pap. CPAP continues to be medically necessary to treat this patient's OSA. F/u one year.    2. CPAP use counseling CPAP Counseling: had a lengthy discussion with the patient regarding the importance of PAP therapy in management of the sleep apnea. Patient appears to understand the risk factor reduction and also understands the risks associated with untreated sleep apnea. Patient will try to make a good faith effort to remain compliant with therapy. Also instructed the patient on proper cleaning of the device including the water must be changed daily if possible and use of distilled water is  preferred. Patient understands that the machine should be regularly cleaned with appropriate recommended cleaning solutions that do not damage the PAP machine for example given white vinegar and water rinses. Other methods such as ozone treatment may not be as good as these simple methods to achieve cleaning.   3. Hypertension associated with diabetes (HCC) Controlled with amlodipine and hydrochlorothiazide and losartan. Continue.     General Counseling: I have discussed the findings of the evaluation and examination with Elspeth.  I have also discussed any further diagnostic evaluation thatmay be needed or ordered today. Otho verbalizes understanding of the findings of todays visit. We also reviewed his medications today and discussed drug interactions and side effects including but not limited excessive drowsiness and altered mental states. We also discussed that there is always a risk not just to him but also people around him. he has been encouraged to call the office with any questions or concerns that should arise related to todays visit.  No orders of the defined types were placed in this encounter.       I have personally obtained a history, examined the patient, evaluated laboratory and imaging results, formulated the assessment and plan and placed orders. This patient was seen today by Lauraine Lay, PA-C in collaboration with Dr. Elfreda Bathe.   Elfreda DELENA Bathe, MD [facility]  Diplomate ABMS Pulmonary Critical Care Medicine and Sleep Medicine

## 2024-02-05 ENCOUNTER — Ambulatory Visit: Admitting: Internal Medicine

## 2024-02-05 VITALS — BP 125/70 | HR 84 | Resp 16 | Ht 63.0 in | Wt 162.0 lb

## 2024-02-05 DIAGNOSIS — I152 Hypertension secondary to endocrine disorders: Secondary | ICD-10-CM | POA: Diagnosis not present

## 2024-02-05 DIAGNOSIS — Z7189 Other specified counseling: Secondary | ICD-10-CM

## 2024-02-05 DIAGNOSIS — E1159 Type 2 diabetes mellitus with other circulatory complications: Secondary | ICD-10-CM | POA: Diagnosis not present

## 2024-02-05 DIAGNOSIS — G4733 Obstructive sleep apnea (adult) (pediatric): Secondary | ICD-10-CM | POA: Diagnosis not present

## 2024-02-05 NOTE — Patient Instructions (Signed)

## 2024-05-29 ENCOUNTER — Encounter: Payer: Self-pay | Admitting: Ophthalmology

## 2024-05-31 NOTE — Discharge Instructions (Signed)

## 2024-06-03 HISTORY — DX: Atherosclerotic heart disease of native coronary artery without angina pectoris: I25.10

## 2024-06-07 ENCOUNTER — Ambulatory Visit
Admission: RE | Admit: 2024-06-07 | Discharge: 2024-06-07 | Disposition: A | Source: Home / Self Care | Attending: Ophthalmology | Admitting: Ophthalmology

## 2024-06-07 ENCOUNTER — Encounter: Payer: Self-pay | Admitting: Ophthalmology

## 2024-06-07 ENCOUNTER — Encounter: Payer: Self-pay | Admitting: Anesthesiology

## 2024-06-07 ENCOUNTER — Other Ambulatory Visit: Payer: Self-pay

## 2024-06-07 ENCOUNTER — Encounter: Admission: RE | Disposition: A | Payer: Self-pay | Source: Home / Self Care | Attending: Ophthalmology

## 2024-06-07 LAB — GLUCOSE, CAPILLARY: Glucose-Capillary: 124 mg/dL — ABNORMAL HIGH (ref 70–99)

## 2024-06-07 MED ORDER — SIGHTPATH DOSE#1 BSS IO SOLN
INTRAOCULAR | Status: DC | PRN
  Administered 2024-06-07: 80 mL via OPHTHALMIC

## 2024-06-07 MED ORDER — LACTATED RINGERS IV SOLN: INTRAVENOUS | Status: DC

## 2024-06-07 MED ORDER — FENTANYL CITRATE (PF) 100 MCG/2ML IJ SOLN
INTRAMUSCULAR | Status: DC | PRN
  Administered 2024-06-07: 50 ug via INTRAVENOUS

## 2024-06-07 MED ORDER — TETRACAINE HCL 0.5 % OP SOLN
1.0000 [drp] | OPHTHALMIC | Status: DC | PRN
  Administered 2024-06-07 (×3): 1 [drp] via OPHTHALMIC

## 2024-06-07 MED ORDER — MOXIFLOXACIN HCL 0.5 % OP SOLN
OPHTHALMIC | Status: DC | PRN
  Administered 2024-06-07: .2 mL via OPHTHALMIC

## 2024-06-07 MED ORDER — TETRACAINE HCL 0.5 % OP SOLN
OPHTHALMIC | Status: AC
  Filled 2024-06-07: qty 4

## 2024-06-07 MED ORDER — LIDOCAINE HCL (PF) 2 % IJ SOLN
INTRAOCULAR | Status: DC | PRN
  Administered 2024-06-07: 4 mL via INTRAOCULAR

## 2024-06-07 MED ORDER — FENTANYL CITRATE (PF) 100 MCG/2ML IJ SOLN
INTRAMUSCULAR | Status: AC
  Filled 2024-06-07: qty 2

## 2024-06-07 MED ORDER — SIGHTPATH DOSE#1 NA HYALUR & NA CHOND-NA HYALUR IO KIT
PACK | INTRAOCULAR | Status: DC | PRN
  Administered 2024-06-07: 1 via OPHTHALMIC

## 2024-06-07 MED ORDER — CYCLOPENTOLATE HCL 2 % OP SOLN
1.0000 [drp] | OPHTHALMIC | Status: AC | PRN
  Administered 2024-06-07 (×3): 1 [drp] via OPHTHALMIC

## 2024-06-07 MED ORDER — PHENYLEPHRINE HCL 10 % OP SOLN
1.0000 [drp] | OPHTHALMIC | Status: AC | PRN
  Administered 2024-06-07 (×3): 1 [drp] via OPHTHALMIC

## 2024-06-07 MED ORDER — PHENYLEPHRINE HCL 10 % OP SOLN
OPHTHALMIC | Status: AC
  Filled 2024-06-07: qty 5

## 2024-06-07 MED ORDER — CYCLOPENTOLATE HCL 2 % OP SOLN
OPHTHALMIC | Status: AC
  Filled 2024-06-07: qty 2

## 2024-06-07 MED ORDER — MIDAZOLAM HCL (PF) 2 MG/2ML IJ SOLN
INTRAMUSCULAR | Status: DC | PRN
  Administered 2024-06-07: 2 mg via INTRAVENOUS

## 2024-06-07 MED ORDER — MIDAZOLAM HCL 2 MG/2ML IJ SOLN
INTRAMUSCULAR | Status: AC
  Filled 2024-06-07: qty 2

## 2024-06-07 MED ORDER — SIGHTPATH DOSE#1 BSS IO SOLN
INTRAOCULAR | Status: DC | PRN
  Administered 2024-06-07: 15 mL via INTRAOCULAR

## 2024-06-07 NOTE — Op Note (Signed)
 OPERATIVE NOTE  [name] [id number] 06/07/2024   PREOPERATIVE DIAGNOSIS:   Nuclear sclerotic cataract right eye.  H25.11 Mild nonproliferative diabetic retinopathy with macular edema, right eye [id number]   POSTOPERATIVE DIAGNOSIS:    same.   PROCEDURE:   CPT [phone] Phacoemusification with posterior chamber intraocular lens placement of the right eye  CPT 67028 Intravitreal injection of kenalog, right eye.  LENS:   Implant Name Type Inv. Item Serial No. Manufacturer Lot No. LRB No. Used Action  LENS IOL TECNIS EYHANCE 20.5 - [id number] Intraocular Lens LENS IOL TECNIS EYHANCE 20.5 [id number] [employer]IGHTPATH  Right 1 Implanted       Procedures: PHACOEMULSIFICATION, CATARACT, WITH IOL INSERTION 4.71 00:39.4 (Right) INJECTION, TRIAMCINOLONE (Right)    SURGEON:  Adine Novak, MD, MPH  ANESTHESIOLOGIST: Anesthesiologist: Lanice Redell POUR, MD CRNA: Veronica Alm BROCKS, CRNA   ANESTHESIA:  Topical with tetracaine  drops augmented with 1% preservative-free intracameral lidocaine .  ESTIMATED BLOOD LOSS: less than 1 mL.   COMPLICATIONS:  None.   DESCRIPTION OF PROCEDURE:  The patient was identified in the holding room and transported to the operating room and placed in the supine position under the operating microscope.  The right eye was identified as the operative eye and it was prepped and draped in the usual sterile ophthalmic fashion.   A 1.0 millimeter clear-corneal paracentesis was made at the 10:30 position. 0.5 ml of preservative-free 1% lidocaine  with epinephrine  was injected into the anterior chamber.  The anterior chamber was filled with viscoelastic.  A 2.4 millimeter keratome was used to make a near-clear corneal incision at the 8:00 position.  A curvilinear capsulorrhexis was made with a cystotome and capsulorrhexis forceps.  Balanced salt solution was used to hydrodissect and hydrodelineate the nucleus.   Phacoemulsification was then used in stop and chop fashion to remove the  lens nucleus and epinucleus.  The remaining cortex was then removed using the irrigation and aspiration handpiece. Viscoelastic was then placed into the capsular bag to distend it for lens placement.  A lens was then injected into the capsular bag.    0.1 mL of Kenalog 40 mg/mL was injected into the vitreous cavity 3.5 mm posterior to the limbus temporally with a 27 gauge needle on a 1 cc syringe.  The remaining viscoelastic was aspirated.   Wounds were hydrated with balanced salt solution.  The anterior chamber was inflated to a physiologic pressure with balanced salt solution.   Intracameral vigamox  0.1 mL undiluted was injected into the eye and a drop placed onto the ocular surface.  No wound leaks were noted.  The patient was taken to the recovery room in stable condition without complications of anesthesia or surgery  Adine Novak 06/07/2024, 12:46 PM

## 2024-06-07 NOTE — Transfer of Care (Signed)
 Immediate Anesthesia Transfer of Care Note  Patient: [name]  Procedure(s) Performed: PHACOEMULSIFICATION, CATARACT, WITH IOL INSERTION 4.71 00:39.4 (Right: Eye) INJECTION, TRIAMCINOLONE (Right: Eye)  Patient Location: PACU  Anesthesia Type: MAC  Level of Consciousness: awake, alert  and patient cooperative  Airway and Oxygen Therapy: Patient Spontanous Breathing  Post-op Assessment: Post-op Vital signs reviewed, Patient's Cardiovascular Status Stable, Respiratory Function Stable, Patent Airway and No signs of Nausea or vomiting  Post-op Vital Signs: Reviewed and stable  Complications: No notable events documented.

## 2024-06-07 NOTE — Anesthesia Postprocedure Evaluation (Signed)
"   Anesthesia Post Note  Patient: [name]  Procedure(s) Performed: PHACOEMULSIFICATION, CATARACT, WITH IOL INSERTION 4.71 00:39.4 (Right: Eye) INJECTION, TRIAMCINOLONE (Right: Eye)  Patient location during evaluation: PACU Anesthesia Type: MAC Level of consciousness: awake and alert Pain management: pain level controlled Vital Signs Assessment: post-procedure vital signs reviewed and stable Respiratory status: spontaneous breathing, nonlabored ventilation, respiratory function stable and patient connected to nasal cannula oxygen Cardiovascular status: stable and blood pressure returned to baseline Postop Assessment: no apparent nausea or vomiting Anesthetic complications: no   No notable events documented.   Last Vitals:  Vitals:   06/07/24 1157 06/07/24 1249  BP: (!) 157/73 116/67  Pulse: 77 73  Resp: 13 17  Temp: 36.7 C 36.6 C  SpO2: 97% 94%    Last Pain:  Vitals:   06/07/24 1249  TempSrc:   PainSc: 0-No pain                 Redell MARLA Breaker      "

## 2024-06-07 NOTE — H&P (Signed)
 [facility]   Primary Care Physician:  [facility] Primary Ophthalmologist: Dr. Adine Novak  Pre-Procedure History & Physical: HPI:  [name] is a 69 y.o. male here for cataract surgery.   Past Medical History:  Diagnosis Date   Anxiety    Arthritis    hips   Cancer (HCC)    skin cancer on face   Coronary artery disease    first degree AV block   Diabetes mellitus without complication (HCC)    type 1   Hypertension    Neuromuscular disorder (HCC)    neuropathy in feet   Sleep apnea    uses CPAP   TIA (transient ischemic attack)     Past Surgical History:  Procedure Laterality Date   CATARACT EXTRACTION W/PHACO Left 01/18/2024   Procedure: PHACOEMULSIFICATION, CATARACT, WITH IOL INSERTION 9.32 01:18.1;  Surgeon: Enola Feliciano Hugger, MD;  Location: [facility];  Service: Ophthalmology;  Laterality: Left;   COLONOSCOPY     COLONOSCOPY WITH PROPOFOL  N/A 11/06/2018   Procedure: COLONOSCOPY WITH PROPOFOL ;  Surgeon: Gaylyn Gladis PENNER, MD;  Location: [facility];  Service: Endoscopy;  Laterality: N/A;    Prior to Admission medications  Medication Sig Start Date End Date Taking? Authorizing Provider  amLODipine (NORVASC) 10 MG tablet Take 10 mg by mouth daily.   Yes [provider]  aspirin  EC 81 MG tablet Take by mouth. 04/20/09  Yes [provider]  atorvastatin  (LIPITOR) 20 MG tablet Take 20 mg by mouth daily. 10/07/21  Yes [provider]  Cholecalciferol (D3) 25 MCG (1000 UT) capsule Take 1,000 Units by mouth daily.   Yes [provider]  hydrochlorothiazide (HYDRODIURIL) 25 MG tablet Take 25 mg by mouth daily. 10/15/21  Yes [provider]  insulin  lispro (HUMALOG) 100 UNIT/ML KwikPen Use up to 50 units/day, divided TID AC meals as per NP instructions, 09/29/22  Yes [provider]  LANTUS SOLOSTAR 100 UNIT/ML Solostar Pen Fill as Lantus. Inject up to 50u daily. 09/29/22  Yes [provider]   Milk Thistle 300 MG CAPS Take 1 capsule by mouth daily.   Yes [provider]  Multiple Vitamin (MULTIVITAMIN WITH MINERALS) TABS tablet Take 1 tablet by mouth daily.   Yes [provider]  Omega-3 Fatty Acids (FISH OIL PO) Take 1 capsule by mouth daily.   Yes [provider]  Insulin  Degludec FlexTouch 200 UNIT/ML SOPN SMARTSIG:0-80 Unit(s) SUB-Q Daily 10/19/21   [provider]  losartan (COZAAR) 100 MG tablet Take 1 tablet by mouth daily. 12/16/22 12/16/23  [provider]  Multiple Vitamins-Minerals (EQ VISION FORMULA 50+ PO) Take 1 tablet by mouth daily.    [provider]  Multiple Vitamins-Minerals (MEGAVITE FRUITS & VEGGIES PO) Take 1 tablet by mouth daily.    [provider]  Multiple Vitamins-Minerals (MENS 50+ MULTI VITAMIN/MIN PO) Take 1 tablet by mouth daily.    [provider]    Allergies as of 04/18/2024   (No Known Allergies)    Family History  Problem Relation Age of Onset   Heart attack Father    Stroke Paternal Grandfather     Social History   Socioeconomic History   Marital status: Married    Spouse name: Not on file   Number of children: Not on file   Years of education: Not on file   Highest education level: Not on file  Occupational History   Occupation: teacher  Tobacco Use   Smoking status: Former  Current packs/day: 0.00    Types: Cigarettes    Quit date: 1990    Years since quitting: 36.1   Smokeless tobacco: Never  Vaping Use   Vaping status: Never Used  Substance and Sexual Activity   Alcohol use: Yes    Comment: mixed drink 1-2 times per month   Drug use: Never   Sexual activity: Yes    Birth control/protection: None  Other Topics Concern   Not on file  Social History Narrative   Live with wife   Social Drivers of Health   Tobacco Use: Medium Risk (06/07/2024)   Patient History    Smoking Tobacco Use: Former    Smokeless Tobacco Use: Never    Passive Exposure:  Not on Actuary Strain: Low Risk (11/24/2023)   Received from [facility]   Overall Financial Resource Strain (CARDIA)    How hard is it for you to pay for the very basics like food, housing, medical care, and heating?: Not very hard  Food Insecurity: No Food Insecurity (11/24/2023)   Received from [facility]   Epic    Within the past 12 months, the food you bought just didn't last and you didn't have money to get more.: Never true    Within the past 12 months, you worried that your food would run out before you got the money to buy more.: Never true  Transportation Needs: No Transportation Needs (11/24/2023)   Received from [facility]   PRAPARE - Transportation    Lack of Transportation (Non-Medical): No    Lack of Transportation (Medical): No  Physical Activity: Inactive (09/26/2023)   Received from [facility]   Exercise Vital Sign    On average, how many minutes do you engage in exercise at this level?: 0 min    On average, how many days per week do you engage in moderate to strenuous exercise (like a brisk walk)?: 0 days  Stress: No Stress Concern Present (09/26/2023)   Received from [facility] Occupational [facility] - Occupational Stress Questionnaire    Feeling of Stress : Not at all  Social Connections: Socially Integrated (09/26/2023)   Received from [facility]   Social Connection and Isolation Panel    Are you married, widowed, divorced, separated, never married, or living with a partner?: Married    How often do you attend meetings of the clubs or organizations you belong to?: More than 4 times per year    Do you belong to any clubs or organizations such as church groups, unions, fraternal or athletic groups, or school groups?: Yes    How often do you attend church or religious services?: More than 4 times per year    How often do you get together with friends or relatives?: More than three times a week    In a  typical week, how many times do you talk on the phone with family, friends, or neighbors?: Three times a week  Intimate Partner Violence: Not At Risk (09/26/2023)   Received from [facility]   Epic    Within the last year, have you been raped or forced to have any kind of sexual activity by your partner or ex-partner?: No    Within the last year, have you been kicked, hit, slapped, or otherwise physically hurt by your partner or ex-partner?: No    Within the last year, have you been humiliated or emotionally abused  in other ways by your partner or ex-partner?: No    Within the last year, have you been afraid of your partner or ex-partner?: No  Depression (PHQ2-9): Not on file  Alcohol Screen: Not on file  Housing: Not on file  Utilities: Low Risk (11/24/2023)   Received from [facility]   Utilities    Within the past 12 months, have you been unable to get utilities(heat, electricity) when it was really needed?: No  Health Literacy: Low Risk (09/26/2023)   Received from [facility] Literacy    How often do you need to have someone help you when you read instructions, pamphlets, or other written material from your doctor or pharmacy?: Never    Review of Systems: See HPI, otherwise negative ROS  Physical Exam: BP (!) 157/73   Pulse 77   Temp 98 F (36.7 C) (Temporal)   Resp 13   Ht 5' 2.99 (1.6 m)   Wt 78 kg   SpO2 97%   BMI 30.48 kg/m  General:   Alert, cooperative. Head:  Normocephalic and atraumatic. Respiratory:  Normal work of breathing. Cardiovascular:  NAD  Impression/Plan: [name] is here for cataract surgery.  Risks, benefits, limitations, and alternatives regarding cataract surgery have been reviewed with the patient.  Questions have been answered.  All parties agreeable.   Adine Novak, MD  06/07/2024, 12:22 PM

## 2024-06-07 NOTE — Anesthesia Preprocedure Evaluation (Signed)
"                                    Anesthesia Evaluation  Patient identified by MRN, date of birth, ID band Patient awake    Reviewed: Allergy & Precautions, H&P , NPO status , Patient's Chart, lab work & pertinent test results  Airway Mallampati: II  TM Distance: >3 FB Neck ROM: Full    Dental no notable dental hx.    Pulmonary sleep apnea , former smoker   Pulmonary exam normal breath sounds clear to auscultation       Cardiovascular hypertension, + CAD  Normal cardiovascular exam Rhythm:Regular Rate:Normal     Neuro/Psych   Anxiety     TIA Neuromuscular disease  negative psych ROS   GI/Hepatic negative GI ROS, Neg liver ROS,,,  Endo/Other  diabetes    Renal/GU negative Renal ROS  negative genitourinary   Musculoskeletal negative musculoskeletal ROS (+)    Abdominal   Peds negative pediatric ROS (+)  Hematology negative hematology ROS (+)   Anesthesia Other Findings   Reproductive/Obstetrics negative OB ROS                              Anesthesia Physical Anesthesia Plan  ASA: 3  Anesthesia Plan: MAC   Post-op Pain Management: Minimal or no pain anticipated   Induction: Intravenous  PONV Risk Score and Plan:   Airway Management Planned:   Additional Equipment:   Intra-op Plan:   Post-operative Plan:   Informed Consent:   Plan Discussed with:   Anesthesia Plan Comments:         Anesthesia Quick Evaluation  "
